# Patient Record
Sex: Male | Born: 1980
Health system: Southern US, Community
[De-identification: ages and names within clinical notes are randomized; demographics above are authoritative.]

## PROBLEM LIST (undated history)

## (undated) DIAGNOSIS — K089 Disorder of teeth and supporting structures, unspecified: Secondary | ICD-10-CM

## (undated) DIAGNOSIS — G8929 Other chronic pain: Secondary | ICD-10-CM

## (undated) HISTORY — PX: APPENDECTOMY: SHX54

---

## 2009-07-16 ENCOUNTER — Emergency Department (HOSPITAL_COMMUNITY): Admission: EM | Admit: 2009-07-16 | Discharge: 2009-07-16 | Payer: Self-pay | Admitting: Emergency Medicine

## 2009-12-20 ENCOUNTER — Emergency Department (HOSPITAL_COMMUNITY): Admission: EM | Admit: 2009-12-20 | Discharge: 2009-12-20 | Payer: Self-pay | Admitting: Emergency Medicine

## 2010-02-27 ENCOUNTER — Emergency Department (HOSPITAL_COMMUNITY): Admission: EM | Admit: 2010-02-27 | Discharge: 2010-02-27 | Payer: Self-pay | Admitting: Emergency Medicine

## 2010-03-17 ENCOUNTER — Emergency Department (HOSPITAL_COMMUNITY): Admission: EM | Admit: 2010-03-17 | Discharge: 2010-03-17 | Payer: Self-pay | Admitting: Emergency Medicine

## 2010-04-05 ENCOUNTER — Emergency Department (HOSPITAL_COMMUNITY): Admission: EM | Admit: 2010-04-05 | Discharge: 2010-04-05 | Payer: Self-pay | Admitting: Emergency Medicine

## 2010-05-05 ENCOUNTER — Emergency Department (HOSPITAL_COMMUNITY): Admission: EM | Admit: 2010-05-05 | Discharge: 2010-05-05 | Payer: Self-pay | Admitting: Emergency Medicine

## 2010-05-18 ENCOUNTER — Emergency Department (HOSPITAL_COMMUNITY): Admission: EM | Admit: 2010-05-18 | Discharge: 2010-05-18 | Payer: Self-pay | Admitting: Emergency Medicine

## 2010-06-02 ENCOUNTER — Emergency Department (HOSPITAL_COMMUNITY): Admission: EM | Admit: 2010-06-02 | Discharge: 2010-06-02 | Payer: Self-pay | Admitting: Emergency Medicine

## 2010-09-05 ENCOUNTER — Emergency Department (HOSPITAL_COMMUNITY): Admission: EM | Admit: 2010-09-05 | Discharge: 2010-09-05 | Payer: Self-pay | Admitting: Emergency Medicine

## 2010-10-29 ENCOUNTER — Emergency Department (HOSPITAL_COMMUNITY)
Admission: EM | Admit: 2010-10-29 | Discharge: 2010-10-29 | Payer: Self-pay | Source: Home / Self Care | Admitting: Emergency Medicine

## 2010-12-29 ENCOUNTER — Emergency Department (HOSPITAL_COMMUNITY)
Admission: EM | Admit: 2010-12-29 | Discharge: 2010-12-29 | Disposition: A | Discharge status: Home / Self Care | Payer: Self-pay | Attending: Emergency Medicine | Admitting: Emergency Medicine

## 2010-12-29 DIAGNOSIS — K029 Dental caries, unspecified: Secondary | ICD-10-CM | POA: Insufficient documentation

## 2010-12-29 DIAGNOSIS — K089 Disorder of teeth and supporting structures, unspecified: Secondary | ICD-10-CM | POA: Insufficient documentation

## 2011-02-04 ENCOUNTER — Emergency Department (HOSPITAL_COMMUNITY)
Admission: EM | Admit: 2011-02-04 | Discharge: 2011-02-04 | Disposition: A | Discharge status: Home / Self Care | Payer: Self-pay | Attending: Emergency Medicine | Admitting: Emergency Medicine

## 2011-02-04 DIAGNOSIS — K089 Disorder of teeth and supporting structures, unspecified: Secondary | ICD-10-CM | POA: Insufficient documentation

## 2011-02-04 DIAGNOSIS — K029 Dental caries, unspecified: Secondary | ICD-10-CM | POA: Insufficient documentation

## 2011-03-25 ENCOUNTER — Emergency Department (HOSPITAL_COMMUNITY)
Admission: EM | Admit: 2011-03-25 | Discharge: 2011-03-25 | Disposition: A | Discharge status: Home / Self Care | Payer: Self-pay | Attending: Emergency Medicine | Admitting: Emergency Medicine

## 2011-03-25 DIAGNOSIS — K089 Disorder of teeth and supporting structures, unspecified: Secondary | ICD-10-CM | POA: Insufficient documentation

## 2011-03-25 DIAGNOSIS — K029 Dental caries, unspecified: Secondary | ICD-10-CM | POA: Insufficient documentation

## 2011-06-25 ENCOUNTER — Emergency Department (HOSPITAL_COMMUNITY)
Admission: EM | Admit: 2011-06-25 | Discharge: 2011-06-25 | Disposition: A | Discharge status: Home / Self Care | Payer: Self-pay | Attending: Emergency Medicine | Admitting: Emergency Medicine

## 2011-06-25 ENCOUNTER — Encounter: Payer: Self-pay | Admitting: Emergency Medicine

## 2011-06-25 DIAGNOSIS — Z87891 Personal history of nicotine dependence: Secondary | ICD-10-CM | POA: Insufficient documentation

## 2011-06-25 DIAGNOSIS — K029 Dental caries, unspecified: Secondary | ICD-10-CM

## 2011-06-25 DIAGNOSIS — K089 Disorder of teeth and supporting structures, unspecified: Secondary | ICD-10-CM | POA: Insufficient documentation

## 2011-06-25 DIAGNOSIS — K0500 Acute gingivitis, plaque induced: Secondary | ICD-10-CM

## 2011-06-25 MED ORDER — IBUPROFEN 800 MG PO TABS: 800.0000 mg | ORAL_TABLET | Freq: Three times a day (TID) | ORAL | Status: AC

## 2011-06-25 MED ORDER — IBUPROFEN 800 MG PO TABS
800.0000 mg | ORAL_TABLET | Freq: Once | ORAL | Status: AC
  Administered 2011-06-25: 800 mg via ORAL
  Filled 2011-06-25: qty 1

## 2011-06-25 MED ORDER — AMOXICILLIN 500 MG PO CAPS: 500.0000 mg | ORAL_CAPSULE | Freq: Three times a day (TID) | ORAL | Status: AC

## 2011-06-25 NOTE — ED Notes (Signed)
C/o front lower teeth pain x 3 days; pt has multiple caries and broken teeth in this area; states has a dentist to f/u with, but is trying to save the money to get in for appt.

## 2011-06-25 NOTE — ED Notes (Signed)
Pt c/o dental pain x 3 days.

## 2011-06-25 NOTE — ED Provider Notes (Signed)
History     Chief Complaint  Patient presents with  . Dental Pain   Patient is a 30 y.o. male presenting with tooth pain. The history is provided by the patient.  Dental PainThe primary symptoms include mouth pain. Primary symptoms do not include headaches, fever, shortness of breath or sore throat. The symptoms began 3 to 5 days ago. The symptoms are worsening. The symptoms are chronic. The symptoms occur constantly (Reports chronic dental pain worse the past 3 days.  Is scheduled to see a dentist the end of August for extractions.).  Additional symptoms include: dental sensitivity to temperature, gum swelling and gum tenderness. Additional symptoms do not include: purulent gums, facial swelling, trouble swallowing and ear pain.    History reviewed. No pertinent past medical history.  History reviewed. No pertinent past surgical history.  History reviewed. No pertinent family history.  History  Substance Use Topics  . Smoking status: Former Games developer  . Smokeless tobacco: Not on file  . Alcohol Use: No      Review of Systems  Constitutional: Negative for fever.  HENT: Negative for ear pain, congestion, sore throat, facial swelling, trouble swallowing, neck pain and neck stiffness.   Eyes: Negative.   Respiratory: Negative for chest tightness and shortness of breath.   Cardiovascular: Negative for chest pain.  Gastrointestinal: Negative for nausea and abdominal pain.  Genitourinary: Negative.   Musculoskeletal: Negative for joint swelling and arthralgias.  Skin: Negative.  Negative for rash and wound.  Neurological: Negative for dizziness, weakness, light-headedness, numbness and headaches.  Hematological: Negative.   Psychiatric/Behavioral: Negative.     Physical Exam  BP 155/97  Pulse 73  Temp(Src) 97.4 F (36.3 C) (Oral)  Resp 17  Ht 5\' 6"  (1.676 m)  Wt 160 lb (72.576 kg)  BMI 25.82 kg/m2  SpO2 100%  Physical Exam  Vitals reviewed. Constitutional: He is oriented  to person, place, and time. He appears well-developed and well-nourished.  HENT:  Head: Normocephalic and atraumatic.  Right Ear: External ear normal.  Left Ear: External ear normal.       Entire dentition is present,  But blackened and mostly broken off to gingival line,  No obvious abscess identified,  But gingivitis present.  Eyes: Conjunctivae are normal.  Neck: Normal range of motion. No thyromegaly present.  Cardiovascular: Normal rate, regular rhythm, normal heart sounds and intact distal pulses.   Pulmonary/Chest: Effort normal and breath sounds normal. He has no wheezes.  Abdominal: Soft. Bowel sounds are normal. There is no tenderness.  Musculoskeletal: Normal range of motion.  Lymphadenopathy:    He has no cervical adenopathy.    He has no axillary adenopathy.  Neurological: He is alert and oriented to person, place, and time.  Skin: Skin is warm and dry.  Psychiatric: He has a normal mood and affect.    ED Course  Procedures   Offered dental blocks,  Patient refused. MDM No abscess identified today. Poor dentition throughout with multiple previous visits for same.  Patient has tried no home meds for relief.      Candis Musa, PA 06/25/11 1130  Medical screening examination/treatment/procedure(s) were performed by non-physician practitioner and as supervising physician I was immediately available for consultation/collaboration.   Sunnie Nielsen, MD 06/29/11 [phone]

## 2011-06-25 NOTE — ED Notes (Signed)
A&ox4; in no distress; instructions reviewed and written copy given; verbalizes understanding

## 2011-10-09 ENCOUNTER — Emergency Department (HOSPITAL_COMMUNITY)
Admission: EM | Admit: 2011-10-09 | Discharge: 2011-10-09 | Disposition: A | Discharge status: Home / Self Care | Payer: Self-pay | Attending: Emergency Medicine | Admitting: Emergency Medicine

## 2011-10-09 ENCOUNTER — Encounter (HOSPITAL_COMMUNITY): Payer: Self-pay | Admitting: *Deleted

## 2011-10-09 DIAGNOSIS — K047 Periapical abscess without sinus: Secondary | ICD-10-CM | POA: Insufficient documentation

## 2011-10-09 DIAGNOSIS — R220 Localized swelling, mass and lump, head: Secondary | ICD-10-CM | POA: Insufficient documentation

## 2011-10-09 DIAGNOSIS — R110 Nausea: Secondary | ICD-10-CM | POA: Insufficient documentation

## 2011-10-09 DIAGNOSIS — R221 Localized swelling, mass and lump, neck: Secondary | ICD-10-CM | POA: Insufficient documentation

## 2011-10-09 DIAGNOSIS — K0889 Other specified disorders of teeth and supporting structures: Secondary | ICD-10-CM

## 2011-10-09 DIAGNOSIS — K029 Dental caries, unspecified: Secondary | ICD-10-CM | POA: Insufficient documentation

## 2011-10-09 DIAGNOSIS — K089 Disorder of teeth and supporting structures, unspecified: Secondary | ICD-10-CM | POA: Insufficient documentation

## 2011-10-09 MED ORDER — AMOXICILLIN 500 MG PO CAPS: 500.0000 mg | ORAL_CAPSULE | Freq: Three times a day (TID) | ORAL | Status: AC

## 2011-10-09 MED ORDER — HYDROCODONE-ACETAMINOPHEN 5-325 MG PO TABS: ORAL_TABLET | ORAL | Status: AC

## 2011-10-09 NOTE — ED Notes (Signed)
Pt c/o abscess teeth; one on each top and one on right lower bottom; pt c/o pain x 3 days

## 2011-10-09 NOTE — ED Provider Notes (Signed)
Medical screening examination/treatment/procedure(s) were performed by non-physician practitioner and as supervising physician I was immediately available for consultation/collaboration.  Geoffery Lyons, MD 10/09/11 [phone]

## 2011-10-09 NOTE — ED Provider Notes (Signed)
History     CSN: [id number] Arrival date & time: 10/09/2011  8:20 AM   First MD Initiated Contact with Patient 10/09/11 0801      Chief Complaint  Patient presents with  . abscess tooth     (Consider location/radiation/quality/duration/timing/severity/associated sxs/prior treatment) Patient is a 30 y.o. male presenting with tooth pain. The history is provided by the patient.  Dental PainThe primary symptoms include mouth pain. Primary symptoms do not include oral bleeding, oral lesions, headaches, fever, sore throat or angioedema. The symptoms began 3 to 5 days ago. The symptoms are unchanged. The symptoms are recurrent. The symptoms occur constantly.  Mouth pain began 3 - 5 days ago. Mouth pain occurs constantly. Mouth pain is unchanged. Affected locations include: teeth and gum(s). The mouth pain is currently at 10/10.  Additional symptoms include: gum swelling and gum tenderness. Additional symptoms do not include: purulent gums, trismus, jaw pain, facial swelling, trouble swallowing, pain with swallowing, swollen glands and fatigue. Medical issues include: smoking and periodontal disease.    History reviewed. No pertinent past medical history.  History reviewed. No pertinent past surgical history.  History reviewed. No pertinent family history.  History  Substance Use Topics  . Smoking status: Former Games developer  . Smokeless tobacco: Not on file  . Alcohol Use: No      Review of Systems  Constitutional: Negative for fever, chills and fatigue.  HENT: Positive for dental problem. Negative for sore throat, facial swelling, mouth sores, trouble swallowing, neck pain and neck stiffness.   Eyes: Negative for visual disturbance.  Gastrointestinal: Positive for nausea.  Musculoskeletal: Negative for myalgias.  Skin: Negative.  Negative for rash.  Neurological: Negative for dizziness, facial asymmetry, weakness, numbness and headaches.  Hematological: Negative for adenopathy. Does  not bruise/bleed easily.  All other systems reviewed and are negative.    Allergies  Review of patient's allergies indicates no known allergies.  Home Medications  No current outpatient prescriptions on file.  BP 137/90  Pulse 81  Temp(Src) 97.7 F (36.5 C) (Oral)  Resp 18  Ht 5\' 6"  (1.676 m)  Wt 150 lb (68.04 kg)  BMI 24.21 kg/m2  SpO2 100%  Physical Exam  Nursing note and vitals reviewed. Constitutional: He is oriented to person, place, and time. He appears well-developed and well-nourished. No distress.  HENT:  Head: Normocephalic and atraumatic. No trismus in the jaw.  Right Ear: Tympanic membrane normal. No mastoid tenderness. No hemotympanum.  Left Ear: Tympanic membrane normal. No mastoid tenderness. No hemotympanum.  Mouth/Throat: Uvula is midline, oropharynx is clear and moist and mucous membranes are normal. He does not have dentures. No oral lesions. Abnormal dentition. Dental caries present. No dental abscesses, uvula swelling or lacerations.       patient has widespread dental decay.  All teeth are black in color and decayed to the gum line.  Swelling and erythema of the gums of the right lower first molar and left upper first molar and right upper second premolar.  Neck: Normal range of motion. Neck supple.  Cardiovascular: Normal rate, regular rhythm and normal heart sounds.   Pulmonary/Chest: Effort normal and breath sounds normal. No respiratory distress. He exhibits no tenderness.  Lymphadenopathy:    He has no cervical adenopathy.  Neurological: He is alert and oriented to person, place, and time. No cranial nerve deficit. He exhibits normal muscle tone. Coordination normal.  Skin: Skin is warm and dry.    ED Course  Procedures (including critical care time)  MDM     8:48 AM patient has widespread dental decay.  All teeth are black in color and decayed to the gum line.  Swelling and erythema of the gums of the right lower first molar and left  upper first molar and right upper second premolar.  No obvious dental abscess at this time.  Patient has been here multiple times for dental issues.  No fever, facial swelling or trismus  Patient / Family / Caregiver understand and agree with initial ED impression and plan with expectations set for ED visit.      Ventura Leggitt L. [place], [place] 10/09/11 [phone]

## 2011-11-18 ENCOUNTER — Emergency Department (HOSPITAL_COMMUNITY)
Admission: EM | Admit: 2011-11-18 | Discharge: 2011-11-18 | Disposition: A | Discharge status: Home / Self Care | Payer: Self-pay | Attending: Emergency Medicine | Admitting: Emergency Medicine

## 2011-11-18 ENCOUNTER — Encounter (HOSPITAL_COMMUNITY): Payer: Self-pay

## 2011-11-18 DIAGNOSIS — K029 Dental caries, unspecified: Secondary | ICD-10-CM | POA: Insufficient documentation

## 2011-11-18 DIAGNOSIS — K0889 Other specified disorders of teeth and supporting structures: Secondary | ICD-10-CM

## 2011-11-18 DIAGNOSIS — K089 Disorder of teeth and supporting structures, unspecified: Secondary | ICD-10-CM | POA: Insufficient documentation

## 2011-11-18 MED ORDER — AMOXICILLIN 500 MG PO CAPS: 500.0000 mg | ORAL_CAPSULE | Freq: Three times a day (TID) | ORAL | Status: AC

## 2011-11-18 MED ORDER — HYDROCODONE-ACETAMINOPHEN 5-325 MG PO TABS: ORAL_TABLET | ORAL | Status: AC

## 2011-11-18 MED ORDER — HYDROCODONE-ACETAMINOPHEN 5-325 MG PO TABS
1.0000 | ORAL_TABLET | Freq: Once | ORAL | Status: AC
  Administered 2011-11-18: 1 via ORAL
  Filled 2011-11-18: qty 1

## 2011-11-18 MED ORDER — PENICILLIN V POTASSIUM 250 MG PO TABS
500.0000 mg | ORAL_TABLET | Freq: Once | ORAL | Status: AC
  Administered 2011-11-18: 500 mg via ORAL
  Filled 2011-11-18: qty 2

## 2011-11-18 NOTE — ED Notes (Signed)
Pt presents with upper and lower left dental pain x 2 days.

## 2011-11-18 NOTE — ED Provider Notes (Signed)
History     CSN: [id number]  Arrival date & time 11/18/11  1739   First MD Initiated Contact with Patient 11/18/11 1747      Chief Complaint  Patient presents with  . Dental Pain    (Consider location/radiation/quality/duration/timing/severity/associated sxs/prior treatment) Patient is a 30 y.o. male presenting with tooth pain. The history is provided by the patient.  Dental PainThe primary symptoms include mouth pain. Primary symptoms do not include dental injury, oral bleeding, headaches, fever, shortness of breath, sore throat, angioedema or cough. The symptoms began 2 days ago. The symptoms are worsening. The symptoms are recurrent. The symptoms occur constantly.  Mouth pain began 24 -48 hours ago. Mouth pain occurs constantly. Affected locations include: teeth and gum(s). The mouth pain is currently at 10/10.  Additional symptoms include: dental sensitivity to temperature, gum swelling and gum tenderness. Additional symptoms do not include: purulent gums, trismus, jaw pain, facial swelling, trouble swallowing, drooling, ear pain and swollen glands. Medical issues include: periodontal disease.    History reviewed. No pertinent past medical history.  History reviewed. No pertinent past surgical history.  History reviewed. No pertinent family history.  History  Substance Use Topics  . Smoking status: Former Games developer  . Smokeless tobacco: Not on file  . Alcohol Use: No      Review of Systems  Constitutional: Negative for fever and chills.  HENT: Positive for dental problem. Negative for ear pain, sore throat, facial swelling, drooling, trouble swallowing, neck pain and neck stiffness.   Respiratory: Negative for cough and shortness of breath.   Gastrointestinal: Negative for nausea and vomiting.  Skin: Negative for rash.  Neurological: Negative for dizziness, weakness, numbness and headaches.  Hematological: Does not bruise/bleed easily.  All other systems reviewed and are  negative.    Allergies  Review of patient's allergies indicates no known allergies.  Home Medications  No current outpatient prescriptions on file.  BP 151/84  Pulse 84  Temp(Src) 98 F (36.7 C) (Oral)  Resp 18  Ht 5\' 6"  (1.676 m)  Wt 140 lb (63.504 kg)  BMI 22.60 kg/m2  SpO2 100%  Physical Exam  Nursing note and vitals reviewed. Constitutional: He is oriented to person, place, and time. He appears well-developed and well-nourished. No distress.  HENT:  Head: Normocephalic and atraumatic. No trismus in the jaw.  Right Ear: Tympanic membrane and ear canal normal. No mastoid tenderness. No hemotympanum.  Left Ear: Tympanic membrane and ear canal normal. No mastoid tenderness. No hemotympanum.  Mouth/Throat: Uvula is midline, oropharynx is clear and moist and mucous membranes are normal. Dental caries present. No dental abscesses or uvula swelling.       Multiple dental caries.  Erythema and edema of the left upper gums  Neck: Normal range of motion. Neck supple.  Cardiovascular: Normal rate, regular rhythm and normal heart sounds.   Pulmonary/Chest: Effort normal and breath sounds normal. No respiratory distress. He exhibits no tenderness.  Abdominal: Soft.  Musculoskeletal: Normal range of motion. He exhibits no tenderness.  Lymphadenopathy:    He has no cervical adenopathy.  Neurological: He is alert and oriented to person, place, and time. No cranial nerve deficit. He exhibits normal muscle tone. Coordination normal.  Skin: Skin is warm.    ED Course  Procedures (including critical care time)          MDM    Pt has multiple dental caries with tooth decay extending to the gums.  Remaining teeth are black in color.  Erythema  and mild swelling of the gums surrounding the left upper first molar.  Non-toxic appearing.  No trismus.          Avonell Lenig L. [place], [place] 11/18/11 1818

## 2011-11-18 NOTE — ED Provider Notes (Signed)
Medical screening examination/treatment/procedure(s) were performed by non-physician practitioner and as supervising physician I was immediately available for consultation/collaboration. Devoria Albe, MD, [facility]   Ward Givens, MD 11/18/11 [phone]

## 2011-12-07 ENCOUNTER — Emergency Department (HOSPITAL_COMMUNITY)
Admission: EM | Admit: 2011-12-07 | Discharge: 2011-12-07 | Disposition: A | Discharge status: Home / Self Care | Payer: Self-pay | Attending: Emergency Medicine | Admitting: Emergency Medicine

## 2011-12-07 ENCOUNTER — Encounter (HOSPITAL_COMMUNITY): Payer: Self-pay | Admitting: Emergency Medicine

## 2011-12-07 DIAGNOSIS — K029 Dental caries, unspecified: Secondary | ICD-10-CM | POA: Insufficient documentation

## 2011-12-07 DIAGNOSIS — K089 Disorder of teeth and supporting structures, unspecified: Secondary | ICD-10-CM | POA: Insufficient documentation

## 2011-12-07 DIAGNOSIS — K0381 Cracked tooth: Secondary | ICD-10-CM | POA: Insufficient documentation

## 2011-12-07 DIAGNOSIS — K0889 Other specified disorders of teeth and supporting structures: Secondary | ICD-10-CM

## 2011-12-07 DIAGNOSIS — K1370 Unspecified lesions of oral mucosa: Secondary | ICD-10-CM | POA: Insufficient documentation

## 2011-12-07 MED ORDER — HYDROCODONE-ACETAMINOPHEN 5-325 MG PO TABS: ORAL_TABLET | ORAL | Status: DC

## 2011-12-07 MED ORDER — PENICILLIN V POTASSIUM 500 MG PO TABS: 500.0000 mg | ORAL_TABLET | Freq: Four times a day (QID) | ORAL | Status: AC

## 2011-12-07 MED ORDER — PENICILLIN V POTASSIUM 250 MG PO TABS
500.0000 mg | ORAL_TABLET | Freq: Once | ORAL | Status: AC
  Administered 2011-12-07: 500 mg via ORAL
  Filled 2011-12-07: qty 2

## 2011-12-07 MED ORDER — IBUPROFEN 800 MG PO TABS
800.0000 mg | ORAL_TABLET | Freq: Once | ORAL | Status: AC
  Administered 2011-12-07: 800 mg via ORAL
  Filled 2011-12-07: qty 1

## 2011-12-07 MED ORDER — HYDROCODONE-ACETAMINOPHEN 5-325 MG PO TABS
1.0000 | ORAL_TABLET | Freq: Once | ORAL | Status: AC
  Administered 2011-12-07: 1 via ORAL
  Filled 2011-12-07: qty 1

## 2011-12-07 MED ORDER — PENICILLIN V POTASSIUM 250 MG PO TABS: 500.0000 mg | ORAL_TABLET | Freq: Once | ORAL | Status: DC

## 2011-12-07 MED ORDER — IBUPROFEN 800 MG PO TABS: 800.0000 mg | ORAL_TABLET | Freq: Once | ORAL | Status: DC

## 2011-12-07 MED ORDER — HYDROCODONE-ACETAMINOPHEN 5-325 MG PO TABS: 1.0000 | ORAL_TABLET | Freq: Once | ORAL | Status: DC

## 2011-12-07 NOTE — ED Provider Notes (Signed)
Medical screening examination/treatment/procedure(s) were performed by non-physician practitioner and as supervising physician I was immediately available for consultation/collaboration.  Donnetta Hutching, MD 12/07/11 [phone]

## 2011-12-07 NOTE — ED Provider Notes (Signed)
History     CSN: [id number]  Arrival date & time 12/07/11  4098   First MD Initiated Contact with Patient 12/07/11 [phone]      Chief Complaint  Patient presents with  . Dental Pain    (Consider location/radiation/quality/duration/timing/severity/associated sxs/prior treatment) Patient is a 31 y.o. male presenting with tooth pain. The history is provided by the patient. No language interpreter was used.  Dental PainThe primary symptoms include mouth pain and dental injury. Primary symptoms do not include fever. The symptoms began 2 days ago. The symptoms are unchanged. The symptoms occur constantly.  Medical issues include: periodontal disease.    History reviewed. No pertinent past medical history.  History reviewed. No pertinent past surgical history.  History reviewed. No pertinent family history.  History  Substance Use Topics  . Smoking status: Former Smoker -- 1.0 packs/day for 1.5 years    Types: Cigarettes    Quit date: 11/25/2010  . Smokeless tobacco: Never Used  . Alcohol Use: No      Review of Systems  Constitutional: Negative for fever.  HENT: Positive for mouth sores and dental problem.   All other systems reviewed and are negative.    Allergies  Review of patient's allergies indicates no known allergies.  Home Medications  No current outpatient prescriptions on file.  BP 134/66  Pulse 64  Temp(Src) 97.9 F (36.6 C) (Oral)  Resp 18  Ht 5\' 6"  (1.676 m)  Wt 140 lb (63.504 kg)  BMI 22.60 kg/m2  SpO2 100%  Physical Exam  Nursing note and vitals reviewed. Constitutional: He is oriented to person, place, and time. He appears well-developed and well-nourished.  HENT:  Head: Normocephalic and atraumatic.  Right Ear: External ear normal.  Left Ear: External ear normal.  Nose: Nose normal.  Mouth/Throat: Oropharynx is clear and moist. No oropharyngeal exudate.       Widespread dental decay evident.  Pt localizes pain to a L lower pre-molar which he  says broke a couple days ago while eating.   Eyes: EOM are normal.  Neck: Normal range of motion.  Cardiovascular: Normal rate, regular rhythm, normal heart sounds and intact distal pulses.   Pulmonary/Chest: Effort normal and breath sounds normal. No respiratory distress.  Abdominal: Soft. He exhibits no distension. There is no tenderness.  Musculoskeletal: Normal range of motion.  Neurological: He is alert and oriented to person, place, and time.  Skin: Skin is warm and dry.  Psychiatric: He has a normal mood and affect. Judgment normal.    ED Course  Procedures (including critical care time)  Labs Reviewed - No data to display No results found.   No diagnosis found.    MDM          Worthy Rancher, PA 12/07/11 [phone]

## 2011-12-07 NOTE — ED Notes (Signed)
Patient c/o right upper and lower dental pain. Broken teeth and carries noted.

## 2011-12-23 ENCOUNTER — Encounter (HOSPITAL_COMMUNITY): Payer: Self-pay | Admitting: *Deleted

## 2011-12-23 ENCOUNTER — Emergency Department (HOSPITAL_COMMUNITY)
Admission: EM | Admit: 2011-12-23 | Discharge: 2011-12-23 | Disposition: A | Discharge status: Home / Self Care | Payer: Self-pay | Attending: Emergency Medicine | Admitting: Emergency Medicine

## 2011-12-23 DIAGNOSIS — K089 Disorder of teeth and supporting structures, unspecified: Secondary | ICD-10-CM | POA: Insufficient documentation

## 2011-12-23 DIAGNOSIS — K029 Dental caries, unspecified: Secondary | ICD-10-CM | POA: Insufficient documentation

## 2011-12-23 MED ORDER — IBUPROFEN 600 MG PO TABS: 600.0000 mg | ORAL_TABLET | Freq: Four times a day (QID) | ORAL | Status: AC | PRN

## 2011-12-23 MED ORDER — AMOXICILLIN 500 MG PO CAPS: 500.0000 mg | ORAL_CAPSULE | Freq: Three times a day (TID) | ORAL | Status: AC

## 2011-12-23 NOTE — ED Provider Notes (Signed)
History     CSN: [id number]  Arrival date & time 12/23/11  4098   First MD Initiated Contact with Patient 12/23/11 0913      Chief Complaint  Patient presents with  . Dental Pain    (Consider location/radiation/quality/duration/timing/severity/associated sxs/prior treatment) Patient is a 31 y.o. male presenting with tooth pain. The history is provided by the patient.  Dental PainThe primary symptoms include mouth pain. Primary symptoms do not include dental injury, fever or sore throat. The symptoms began 2 days ago. The symptoms are worsening. The symptoms occur constantly.  Additional symptoms include: dental sensitivity to temperature, gum swelling, gum tenderness and jaw pain. Additional symptoms do not include: trismus, trouble swallowing and pain with swallowing.  Pt with severe dental decay in all teeth, here with increased pain in left lower jaw and left upper jaw. States has appointment with a dentist in one month. Unable to see them sooner due to financial strain. PT denies any fever, chills, facial swelling, malaise. Denies taking any medications. Hx of previous multiple visits here for the same.   History reviewed. No pertinent past medical history.  History reviewed. No pertinent past surgical history.  No family history on file.  History  Substance Use Topics  . Smoking status: Former Smoker -- 1.0 packs/day for 1.5 years    Types: Cigarettes    Quit date: 11/25/2010  . Smokeless tobacco: Never Used  . Alcohol Use: No      Review of Systems  Constitutional: Negative for fever and chills.  HENT: Positive for dental problem. Negative for sore throat and trouble swallowing.   Eyes: Negative.   Respiratory: Negative.   Cardiovascular: Negative.   Gastrointestinal: Negative.   Genitourinary: Negative.   Musculoskeletal: Negative.   Neurological: Negative.   Psychiatric/Behavioral: Negative.     Allergies  Review of patient's allergies indicates no known  allergies.  Home Medications   Current Outpatient Rx  Name Route Sig Dispense Refill  . HYDROCODONE-ACETAMINOPHEN 5-325 MG PO TABS  One po q 4-6 hrs prn pain 20 tablet 0  . AMOXICILLIN 500 MG PO CAPS Oral Take 1 capsule (500 mg total) by mouth 3 (three) times daily. 21 capsule 0  . IBUPROFEN 600 MG PO TABS Oral Take 1 tablet (600 mg total) by mouth every 6 (six) hours as needed for pain. 30 tablet 0    BP 134/85  Pulse 80  Temp(Src) 98.1 F (36.7 C) (Oral)  Resp 18  Wt 160 lb (72.576 kg)  SpO2 100%  Physical Exam  Nursing note and vitals reviewed. Constitutional: He appears well-developed and well-nourished. No distress.  HENT:  Head: Normocephalic.  Right Ear: External ear normal.  Left Ear: External ear normal.  Nose: Nose normal.  Mouth/Throat: Oropharynx is clear and moist.       Dental decay of all teeth, most eroded and decayed down to the gumline. No obvious gum swelling, or an abscess. Tender to palpation over left lower 1st and 2nd molar and over left upper 1st molar.   Eyes: Conjunctivae are normal.  Neck: Neck supple.  Cardiovascular: Normal rate and regular rhythm.   Pulmonary/Chest: Effort normal and breath sounds normal. No respiratory distress.  Musculoskeletal: Normal range of motion.  Neurological: He is alert.  Skin: Skin is warm and dry.  Psychiatric: He has a normal mood and affect.    ED Course  Procedures (including critical care time)  Widespread dental decay. Pt needs to see an oral surgeon to get teeth pulled  out. Here afebrile, no facial swelling, no signs of an abscess on exam. Possble gingivitis. Will treat with amoxicillin and follow up.  1. Dental decay       MDM          Lottie Mussel, PA 12/23/11 [phone]

## 2011-12-23 NOTE — ED Notes (Signed)
Pt states "Have an appt with Dr. Hewitt Blade the end of February, I haven't had the money to go but I'll have my taxes back then"; pt presents with all teeth decayed & to the gumline.

## 2011-12-23 NOTE — ED Provider Notes (Signed)
Medical screening examination/treatment/procedure(s) were performed by non-physician practitioner and as supervising physician I was immediately available for consultation/collaboration.  Nicholes Stairs, MD 12/23/11 1016

## 2012-03-05 ENCOUNTER — Encounter (HOSPITAL_COMMUNITY): Payer: Self-pay | Admitting: *Deleted

## 2012-03-05 ENCOUNTER — Emergency Department (HOSPITAL_COMMUNITY)
Admission: EM | Admit: 2012-03-05 | Discharge: 2012-03-05 | Disposition: A | Discharge status: Home / Self Care | Payer: Self-pay | Attending: Emergency Medicine | Admitting: Emergency Medicine

## 2012-03-05 DIAGNOSIS — K029 Dental caries, unspecified: Secondary | ICD-10-CM | POA: Insufficient documentation

## 2012-03-05 DIAGNOSIS — Z87891 Personal history of nicotine dependence: Secondary | ICD-10-CM | POA: Insufficient documentation

## 2012-03-05 DIAGNOSIS — K0889 Other specified disorders of teeth and supporting structures: Secondary | ICD-10-CM

## 2012-03-05 MED ORDER — OXYCODONE-ACETAMINOPHEN 5-325 MG PO TABS: ORAL_TABLET | ORAL | Status: DC

## 2012-03-05 MED ORDER — AMOXICILLIN 500 MG PO CAPS: ORAL_CAPSULE | ORAL | Status: DC

## 2012-03-05 NOTE — ED Notes (Signed)
Dental pain , left jaw

## 2012-03-05 NOTE — ED Provider Notes (Signed)
History     CSN: [id number]  Arrival date & time 03/05/12  1041   First MD Initiated Contact with Patient 03/05/12 1228      Chief Complaint  Patient presents with  . Dental Pain    (Consider location/radiation/quality/duration/timing/severity/associated sxs/prior treatment) Patient is a 31 y.o. male presenting with tooth pain. The history is provided by the patient.  Dental PainThe primary symptoms include mouth pain and headaches. Primary symptoms do not include shortness of breath or cough. The symptoms began 3 to 5 days ago. The symptoms are worsening. The symptoms occur constantly.  The headache is not associated with photophobia.  Additional symptoms include: gum tenderness and jaw pain. Additional symptoms do not include: nosebleeds. Medical issues do not include: alcohol problem, smoking and immunosuppression.    History reviewed. No pertinent past medical history.  History reviewed. No pertinent past surgical history.  No family history on file.  History  Substance Use Topics  . Smoking status: Former Smoker -- 1.0 packs/day for 1.5 years    Types: Cigarettes    Quit date: 11/25/2010  . Smokeless tobacco: Never Used  . Alcohol Use: No      Review of Systems  Constitutional: Negative for activity change.       All ROS Neg except as noted in HPI  HENT: Positive for dental problem. Negative for nosebleeds and neck pain.   Eyes: Negative for photophobia and discharge.  Respiratory: Negative for cough, shortness of breath and wheezing.   Cardiovascular: Negative for chest pain and palpitations.  Gastrointestinal: Negative for abdominal pain and blood in stool.  Genitourinary: Negative for dysuria, frequency and hematuria.  Musculoskeletal: Negative for back pain and arthralgias.  Skin: Negative.   Neurological: Positive for headaches. Negative for dizziness, seizures and speech difficulty.  Psychiatric/Behavioral: Negative for hallucinations and confusion.     Allergies  Review of patient's allergies indicates no known allergies.  Home Medications   Current Outpatient Rx  Name Route Sig Dispense Refill  . HYDROCODONE-ACETAMINOPHEN 5-325 MG PO TABS  One po q 4-6 hrs prn pain 20 tablet 0    BP 128/74  Pulse 81  Temp(Src) 97.5 F (36.4 C) (Oral)  Resp 20  Ht 5\' 6"  (1.676 m)  Wt 150 lb (68.04 kg)  BMI 24.21 kg/m2  SpO2 100%  Physical Exam  Nursing note and vitals reviewed. Constitutional: He is oriented to person, place, and time. He appears well-developed and well-nourished.  Non-toxic appearance.  HENT:  Head: Normocephalic.  Right Ear: Tympanic membrane and external ear normal.  Left Ear: Tympanic membrane and external ear normal.       There is extensive dental caries of the upper and lower jaw areas. There are multiple teeth that are decayed to and below the gumline. There is some swelling of the common on the right lower area. No visible abscess appreciated. The airway is patent. The speech is understandable.  Eyes: EOM and lids are normal. Pupils are equal, round, and reactive to light.  Neck: Normal range of motion. Neck supple. Carotid bruit is not present.  Cardiovascular: Normal rate, regular rhythm, normal heart sounds, intact distal pulses and normal pulses.   Pulmonary/Chest: Breath sounds normal. No respiratory distress.  Abdominal: Soft. Bowel sounds are normal. There is no tenderness. There is no guarding.  Musculoskeletal: Normal range of motion.  Lymphadenopathy:       Head (right side): No submandibular adenopathy present.       Head (left side): No submandibular adenopathy present.  He has no cervical adenopathy.  Neurological: He is alert and oriented to person, place, and time. He has normal strength. No cranial nerve deficit or sensory deficit.  Skin: Skin is warm and dry.  Psychiatric: He has a normal mood and affect. His speech is normal.    ED Course  Procedures (including critical care  time)  Labs Reviewed - No data to display No results found.   No diagnosis found.    MDM  I have reviewed nursing notes, vital signs, and all appropriate lab and imaging results for this patient. Patient has multiple dental caries of the upper and lower jaw area. There is no visible abscess appreciated. Patient treated with amoxicillin, and narcotic pain medication. Patient strongly advised to see a dentist as soon as possible. Inflammation along the dental [place] the [place] has been given.       Kathie Dike, [place] 03/05/12 1243

## 2012-03-05 NOTE — ED Notes (Signed)
Pt DC to home with steady gait 

## 2012-03-05 NOTE — Discharge Instructions (Signed)
Dental Pain  A tooth ache may be caused by cavities (tooth decay). Cavities expose the nerve of the tooth to air and hot or cold temperatures. It may come from an infection or abscess (also called a boil or furuncle) around your tooth. It is also often caused by dental caries (tooth decay). This causes the pain you are having.  DIAGNOSIS   Your caregiver can diagnose this problem by exam.  TREATMENT   · If caused by an infection, it may be treated with medications which kill germs (antibiotics) and pain medications as prescribed by your caregiver. Take medications as directed.  · Only take over-the-counter or prescription medicines for pain, discomfort, or fever as directed by your caregiver.  · Whether the tooth ache today is caused by infection or dental disease, you should see your dentist as soon as possible for further care.  SEEK MEDICAL CARE IF:  The exam and treatment you received today has been provided on an emergency basis only. This is not a substitute for complete medical or dental care. If your problem worsens or new problems (symptoms) appear, and you are unable to meet with your dentist, call or return to this location.  SEEK IMMEDIATE MEDICAL CARE IF:   · You have a fever.  · You develop redness and swelling of your face, jaw, or neck.  · You are unable to open your mouth.  · You have severe pain uncontrolled by pain medicine.  MAKE SURE YOU:   · Understand these instructions.  · Will watch your condition.  · Will get help right away if you are not doing well or get worse.  Document Released: 11/11/2005 Document Revised: 10/31/2011 Document Reviewed: 06/29/2008  [employer]® Patient Information ©2012 [employer].

## 2012-03-21 NOTE — ED Provider Notes (Signed)
Evaluation and management procedures were performed by the PA/NP/resident physician under my supervision/collaboration.   Felisa Bonier, MD 03/21/12 2013

## 2012-04-15 ENCOUNTER — Encounter (HOSPITAL_COMMUNITY): Payer: Self-pay | Admitting: Emergency Medicine

## 2012-04-15 ENCOUNTER — Emergency Department (HOSPITAL_COMMUNITY)
Admission: EM | Admit: 2012-04-15 | Discharge: 2012-04-15 | Disposition: A | Discharge status: Home / Self Care | Payer: Self-pay | Attending: Emergency Medicine | Admitting: Emergency Medicine

## 2012-04-15 DIAGNOSIS — K0889 Other specified disorders of teeth and supporting structures: Secondary | ICD-10-CM

## 2012-04-15 DIAGNOSIS — K089 Disorder of teeth and supporting structures, unspecified: Secondary | ICD-10-CM | POA: Insufficient documentation

## 2012-04-15 DIAGNOSIS — K029 Dental caries, unspecified: Secondary | ICD-10-CM | POA: Insufficient documentation

## 2012-04-15 MED ORDER — OXYCODONE-ACETAMINOPHEN 5-325 MG PO TABS: ORAL_TABLET | ORAL | Status: DC

## 2012-04-15 MED ORDER — OXYCODONE-ACETAMINOPHEN 5-325 MG PO TABS
1.0000 | ORAL_TABLET | Freq: Once | ORAL | Status: AC
  Administered 2012-04-15: 1 via ORAL
  Filled 2012-04-15: qty 1

## 2012-04-15 MED ORDER — AMOXICILLIN 250 MG PO CAPS
500.0000 mg | ORAL_CAPSULE | Freq: Once | ORAL | Status: AC
  Administered 2012-04-15: 500 mg via ORAL
  Filled 2012-04-15: qty 2

## 2012-04-15 MED ORDER — AMOXICILLIN 500 MG PO CAPS: 500.0000 mg | ORAL_CAPSULE | Freq: Three times a day (TID) | ORAL | Status: AC

## 2012-04-15 MED ORDER — IBUPROFEN 800 MG PO TABS
800.0000 mg | ORAL_TABLET | Freq: Once | ORAL | Status: AC
  Administered 2012-04-15: 800 mg via ORAL
  Filled 2012-04-15: qty 1

## 2012-04-15 NOTE — ED Provider Notes (Signed)
History     CSN: [id number]  Arrival date & time 04/15/12  0909   First MD Initiated Contact with Patient 04/15/12 [phone]      Chief Complaint  Patient presents with  . Dental Pain    (Consider location/radiation/quality/duration/timing/severity/associated sxs/prior treatment) HPI Comments: Chronic dental pain.  States he has appt in 2 weeks with dr. Warren Danes to have teeth extracted.    Patient is a 31 y.o. male presenting with tooth pain. The history is provided by the patient. No language interpreter was used.  Dental PainThe primary symptoms include mouth pain. Primary symptoms do not include dental injury or fever. Episode onset: chronic. The symptoms are unchanged. The symptoms occur constantly.    History reviewed. No pertinent past medical history.  History reviewed. No pertinent past surgical history.  No family history on file.  History  Substance Use Topics  . Smoking status: Former Smoker -- 1.0 packs/day for 1.5 years    Types: Cigarettes    Quit date: 11/25/2010  . Smokeless tobacco: Never Used  . Alcohol Use: No      Review of Systems  Constitutional: Negative for fever.  HENT: Positive for dental problem.   All other systems reviewed and are negative.    Allergies  Review of patient's allergies indicates no known allergies.  Home Medications   Current Outpatient Rx  Name Route Sig Dispense Refill  . HYDROCODONE-ACETAMINOPHEN 5-325 MG PO TABS Oral Take 1 tablet by mouth every 4 (four) hours as needed. One po q 4-6 hrs prn pain    . OXYCODONE-ACETAMINOPHEN 5-325 MG PO TABS Oral Take 1 tablet by mouth every 4 (four) hours as needed. 1 po tid with food for pain if needed.    . AMOXICILLIN 500 MG PO CAPS Oral Take 1 capsule (500 mg total) by mouth 3 (three) times daily. 21 capsule 0  . OXYCODONE-ACETAMINOPHEN 5-325 MG PO TABS  One tab po q 6 hrs prn pain 20 tablet 0    BP 130/80  Pulse 75  Temp 97.5 F (36.4 C)  Resp 18  Ht 5\' 6"  (1.676 m)  Wt  150 lb (68.04 kg)  BMI 24.21 kg/m2  SpO2 99%  Physical Exam  Nursing note and vitals reviewed. Constitutional: He is oriented to person, place, and time. He appears well-developed and well-nourished.  HENT:  Head: Normocephalic and atraumatic. No trismus in the jaw.  Mouth/Throat: Uvula is midline, oropharynx is clear and moist and mucous membranes are normal. Dental caries present. No uvula swelling.       Widespread dental decay.  All teeth decayed to gumline.localizes pain to lower incisor region.  Eyes: EOM are normal.  Neck: Normal range of motion.  Cardiovascular: Normal rate, regular rhythm, normal heart sounds and intact distal pulses.   Pulmonary/Chest: Effort normal and breath sounds normal. No respiratory distress.  Abdominal: Soft. He exhibits no distension. There is no tenderness.  Musculoskeletal: Normal range of motion.  Neurological: He is alert and oriented to person, place, and time.  Skin: Skin is warm and dry.  Psychiatric: He has a normal mood and affect. Judgment normal.    ED Course  Procedures (including critical care time)  Labs Reviewed - No data to display No results found.   1. Pain, dental       MDM  Dental pain rx-amoxicillin 500 mg TID, 30 rx oxycocdone, 20 OTC ibuprofen 800 mg TID.   F/u with dr. Warren Danes as planned.  Worthy Rancher, PA 04/15/12 1032

## 2012-04-15 NOTE — Discharge Instructions (Signed)
Dental Pain A tooth ache may be caused by cavities (tooth decay). Cavities expose the nerve of the tooth to air and hot or cold temperatures. It may come from an infection or abscess (also called a boil or furuncle) around your tooth. It is also often caused by dental caries (tooth decay). This causes the pain you are having. DIAGNOSIS  Your caregiver can diagnose this problem by exam. TREATMENT   If caused by an infection, it may be treated with medications which kill germs (antibiotics) and pain medications as prescribed by your caregiver. Take medications as directed.   Only take over-the-counter or prescription medicines for pain, discomfort, or fever as directed by your caregiver.   Whether the tooth ache today is caused by infection or dental disease, you should see your dentist as soon as possible for further care.  SEEK MEDICAL CARE IF: The exam and treatment you received today has been provided on an emergency basis only. This is not a substitute for complete medical or dental care. If your problem worsens or new problems (symptoms) appear, and you are unable to meet with your dentist, call or return to this location. SEEK IMMEDIATE MEDICAL CARE IF:   You have a fever.   You develop redness and swelling of your face, jaw, or neck.   You are unable to open your mouth.   You have severe pain uncontrolled by pain medicine.  MAKE SURE YOU:   Understand these instructions.   Will watch your condition.   Will get help right away if you are not doing well or get worse.  Document Released: 11/11/2005 Document Revised: 10/31/2011 Document Reviewed: 06/29/2008 [facility] Patient Information 2012 [place], [place].    Take the meds as directed.  Take ibuprofen 800 mg every 8 hrs with food.  Follow up with dr. Warren Danes as planned.

## 2012-04-15 NOTE — ED Notes (Signed)
Pt c/o lower toothache x 3 days

## 2012-04-15 NOTE — ED Provider Notes (Signed)
Medical screening examination/treatment/procedure(s) were performed by non-physician practitioner and as supervising physician I was immediately available for consultation/collaboration.  Donshay Lupinski, MD 04/15/12 1114 

## 2012-05-31 ENCOUNTER — Encounter (HOSPITAL_COMMUNITY): Payer: Self-pay

## 2012-05-31 ENCOUNTER — Emergency Department (HOSPITAL_COMMUNITY)
Admission: EM | Admit: 2012-05-31 | Discharge: 2012-05-31 | Disposition: A | Discharge status: Home / Self Care | Payer: Self-pay | Attending: Emergency Medicine | Admitting: Emergency Medicine

## 2012-05-31 DIAGNOSIS — K029 Dental caries, unspecified: Secondary | ICD-10-CM | POA: Insufficient documentation

## 2012-05-31 DIAGNOSIS — Z87891 Personal history of nicotine dependence: Secondary | ICD-10-CM | POA: Insufficient documentation

## 2012-05-31 DIAGNOSIS — G8929 Other chronic pain: Secondary | ICD-10-CM

## 2012-05-31 MED ORDER — HYDROCODONE-ACETAMINOPHEN 5-325 MG PO TABS
1.0000 | ORAL_TABLET | Freq: Once | ORAL | Status: AC
  Administered 2012-05-31: 1 via ORAL
  Filled 2012-05-31: qty 1

## 2012-05-31 MED ORDER — HYDROCODONE-ACETAMINOPHEN 5-325 MG PO TABS: 1.0000 | ORAL_TABLET | Freq: Four times a day (QID) | ORAL | Status: AC | PRN

## 2012-05-31 MED ORDER — AMOXICILLIN 500 MG PO CAPS: 500.0000 mg | ORAL_CAPSULE | Freq: Three times a day (TID) | ORAL | Status: AC

## 2012-05-31 MED ORDER — IBUPROFEN 800 MG PO TABS
800.0000 mg | ORAL_TABLET | Freq: Once | ORAL | Status: AC
  Administered 2012-05-31: 800 mg via ORAL
  Filled 2012-05-31: qty 1

## 2012-05-31 MED ORDER — PENICILLIN V POTASSIUM 250 MG PO TABS
500.0000 mg | ORAL_TABLET | Freq: Once | ORAL | Status: AC
  Administered 2012-05-31: 500 mg via ORAL
  Filled 2012-05-31: qty 2

## 2012-05-31 NOTE — ED Provider Notes (Signed)
Medical screening examination/treatment/procedure(s) were performed by non-physician practitioner and as supervising physician I was immediately available for consultation/collaboration.   Carleene Cooper III, MD 05/31/12 [phone]

## 2012-05-31 NOTE — ED Notes (Signed)
Complain of dental pain 

## 2012-05-31 NOTE — ED Provider Notes (Signed)
History     CSN: [id number]  Arrival date & time 05/31/12  1209   First MD Initiated Contact with Patient 05/31/12 1239      Chief Complaint  Patient presents with  . Dental Pain    (Consider location/radiation/quality/duration/timing/severity/associated sxs/prior treatment) HPI Comments: Has appt with dentist in [place] on July 01, 2012.  Patient is a 31 y.o. male presenting with tooth pain. The history is provided by the patient. No language interpreter was used.  Dental PainThe primary symptoms include mouth pain. Primary symptoms do not include dental injury or fever. Episode onset: chronic pain.  waxes and wanes. The symptoms are waxing and waning.  Additional symptoms include: dental sensitivity to temperature. Additional symptoms do not include: gum swelling, trismus, facial swelling, trouble swallowing, ear pain and swollen glands.    History reviewed. No pertinent past medical history.  History reviewed. No pertinent past surgical history.  No family history on file.  History  Substance Use Topics  . Smoking status: Former Smoker -- 1.0 packs/day for 1.5 years    Types: Cigarettes    Quit date: 11/25/2010  . Smokeless tobacco: Never Used  . Alcohol Use: No      Review of Systems  Constitutional: Negative for fever and chills.  HENT: Positive for dental problem. Negative for ear pain, facial swelling and trouble swallowing.   Cardiovascular: Negative for chest pain.  All other systems reviewed and are negative.    Allergies  Review of patient's allergies indicates no known allergies.  Home Medications   Current Outpatient Rx  Name Route Sig Dispense Refill  . ACETAMINOPHEN 500 MG PO TABS Oral Take 500 mg by mouth every 6 (six) hours as needed. For pain      BP 125/77  Pulse 70  Temp 98 F (36.7 C) (Oral)  Resp 18  SpO2 100%  Physical Exam  Nursing note and vitals reviewed. Constitutional: He is oriented to person, place, and time. He appears  well-developed and well-nourished.  HENT:  Head: Normocephalic and atraumatic.  Mouth/Throat: Uvula is midline and mucous membranes are normal. Dental caries present. No dental abscesses or uvula swelling.    Eyes: EOM are normal.  Neck: Normal range of motion.  Cardiovascular: Normal rate, regular rhythm, normal heart sounds and intact distal pulses.   Pulmonary/Chest: Effort normal and breath sounds normal. No respiratory distress.  Abdominal: Soft. He exhibits no distension. There is no tenderness.  Musculoskeletal: Normal range of motion.  Neurological: He is alert and oriented to person, place, and time.  Skin: Skin is warm and dry.  Psychiatric: He has a normal mood and affect. Judgment normal.    ED Course  Procedures (including critical care time)  Labs Reviewed - No data to display No results found.   No diagnosis found.    MDM   wide spread dental decay.  No obvious abscess. rx-amoxicillin 500 mg, 30 rx-hydrocodone, 20 OTC ibuprofen 800 mg  F/u with dentist as pla nned.        Evalina Field, [place] 05/31/12 1331

## 2012-05-31 NOTE — ED Notes (Signed)
Pt c/o pain in his left lower teeth and right upper teeth. States that he has an abscess. States that pain started 2 days ago. Alert and oriented x 3. Skin warm and dry. Color pink.

## 2012-08-06 ENCOUNTER — Emergency Department (HOSPITAL_COMMUNITY)
Admission: EM | Admit: 2012-08-06 | Discharge: 2012-08-06 | Disposition: A | Discharge status: Home / Self Care | Payer: Self-pay | Attending: Emergency Medicine | Admitting: Emergency Medicine

## 2012-08-06 ENCOUNTER — Encounter (HOSPITAL_COMMUNITY): Payer: Self-pay | Admitting: *Deleted

## 2012-08-06 DIAGNOSIS — K029 Dental caries, unspecified: Secondary | ICD-10-CM | POA: Insufficient documentation

## 2012-08-06 DIAGNOSIS — Z87891 Personal history of nicotine dependence: Secondary | ICD-10-CM | POA: Insufficient documentation

## 2012-08-06 DIAGNOSIS — K0889 Other specified disorders of teeth and supporting structures: Secondary | ICD-10-CM

## 2012-08-06 MED ORDER — HYDROCODONE-ACETAMINOPHEN 5-325 MG PO TABS
1.0000 | ORAL_TABLET | Freq: Once | ORAL | Status: AC
  Administered 2012-08-06: 1 via ORAL
  Filled 2012-08-06: qty 1

## 2012-08-06 MED ORDER — TRAMADOL HCL 50 MG PO TABS: 50.0000 mg | ORAL_TABLET | Freq: Four times a day (QID) | ORAL | Status: AC | PRN

## 2012-08-06 MED ORDER — PENICILLIN V POTASSIUM 500 MG PO TABS: 500.0000 mg | ORAL_TABLET | Freq: Four times a day (QID) | ORAL | Status: AC

## 2012-08-06 NOTE — ED Notes (Signed)
Pt c/o pain to left lower jaw and right upper jaw. Pt states pain began 2 days ago

## 2012-08-06 NOTE — ED Provider Notes (Cosign Needed)
History   This chart was scribed for Benny Lennert, MD by Sofie Rower. The patient was seen in room [facility]12/[facility]12 and the patient's care was started at 7:17AM.     CSN: [id number]  Arrival date & time 08/06/12  0644   First MD Initiated Contact with Patient 08/06/12 [phone]      Chief Complaint  Patient presents with  . Dental Pain    (Consider location/radiation/quality/duration/timing/severity/associated sxs/prior treatment) Patient is a 31 y.o. male presenting with tooth pain. The history is provided by the patient. No language interpreter was used.  Dental PainThe primary symptoms include mouth pain. Primary symptoms do not include dental injury, sore throat or cough. The symptoms began 2 days ago. The symptoms are worsening. The symptoms are new. The symptoms occur constantly.  Mouth pain began 24 -48 hours ago. Mouth pain occurs constantly. Mouth pain is unchanged. Affected locations include: teeth.  Additional symptoms include: jaw pain. Additional symptoms do not include: ear pain and hearing loss.    History reviewed. No pertinent past medical history.  History reviewed. No pertinent past surgical history.  History reviewed. No pertinent family history.  History  Substance Use Topics  . Smoking status: Former Smoker -- 1.0 packs/day for 1.5 years    Types: Cigarettes    Quit date: 11/25/2010  . Smokeless tobacco: Never Used  . Alcohol Use: No      Review of Systems  HENT: Negative for hearing loss, ear pain and sore throat.   Respiratory: Negative for cough.   All other systems reviewed and are negative.    Allergies  Review of patient's allergies indicates no known allergies.  Home Medications   Current Outpatient Rx  Name Route Sig Dispense Refill  . ACETAMINOPHEN 500 MG PO TABS Oral Take 500 mg by mouth every 6 (six) hours as needed. For pain      BP 144/85  Pulse 85  Temp 97.9 F (36.6 C) (Oral)  Resp 18  Ht 5\' 6"  (1.676 m)  Wt 150 lb (68.04 kg)   BMI 24.21 kg/m2  SpO2 98%  Physical Exam  Nursing note and vitals reviewed. Constitutional: He is oriented to person, place, and time. He appears well-developed.  HENT:  Head: Normocephalic.       Pt with extremely poor dentition. Multiple teeth with decay detected.   Eyes: Conjunctivae normal are normal.  Neck: No tracheal deviation present.  Cardiovascular:  No murmur heard. Musculoskeletal: Normal range of motion.  Neurological: He is oriented to person, place, and time.  Skin: Skin is warm.  Psychiatric: He has a normal mood and affect.    ED Course  Procedures (including critical care time)  DIAGNOSTIC STUDIES: Oxygen Saturation is 98% on room air, normal by my interpretation.    COORDINATION OF CARE:    7:36AM- Application of antibiotics discussed. Pt agrees with treatment.   Labs Reviewed - No data to display No results found.   No diagnosis found.    MDM        The chart was scribed for me under my direct supervision.  I personally performed the history, physical, and medical decision making and all procedures in the evaluation of this patient.Benny Lennert, MD 08/06/12 [phone]

## 2012-10-17 ENCOUNTER — Encounter (HOSPITAL_COMMUNITY): Payer: Self-pay | Admitting: Emergency Medicine

## 2012-10-17 ENCOUNTER — Emergency Department (HOSPITAL_COMMUNITY)
Admission: EM | Admit: 2012-10-17 | Discharge: 2012-10-17 | Disposition: A | Discharge status: Home / Self Care | Payer: Self-pay | Attending: Emergency Medicine | Admitting: Emergency Medicine

## 2012-10-17 DIAGNOSIS — Z87891 Personal history of nicotine dependence: Secondary | ICD-10-CM | POA: Insufficient documentation

## 2012-10-17 DIAGNOSIS — K0889 Other specified disorders of teeth and supporting structures: Secondary | ICD-10-CM

## 2012-10-17 DIAGNOSIS — K089 Disorder of teeth and supporting structures, unspecified: Secondary | ICD-10-CM | POA: Insufficient documentation

## 2012-10-17 MED ORDER — AMOXICILLIN 500 MG PO CAPS: 500.0000 mg | ORAL_CAPSULE | Freq: Three times a day (TID) | ORAL | Status: DC

## 2012-10-17 MED ORDER — IBUPROFEN 800 MG PO TABS: 800.0000 mg | ORAL_TABLET | Freq: Three times a day (TID) | ORAL | Status: DC

## 2012-10-17 MED ORDER — HYDROCODONE-ACETAMINOPHEN 7.5-500 MG/15ML PO SOLN: 7.5000 mL | Freq: Four times a day (QID) | ORAL | Status: DC | PRN

## 2012-10-17 NOTE — ED Notes (Signed)
Pt complains of dental pain, states he has an abscessed tooth to Left upper back tooth and lower right back tooth. Pt states pain x3 days. No other complaints

## 2012-10-17 NOTE — ED Provider Notes (Signed)
History     CSN: [id number]  Arrival date & time 10/17/12  2440   First MD Initiated Contact with Patient 10/17/12 [phone]      Chief Complaint  Patient presents with  . Dental Pain    (Consider location/radiation/quality/duration/timing/severity/associated sxs/prior treatment) HPI History provided by patient. Dental pain to left upper and right lower. Onset 3 days ago. No fevers. Pain is sharp in quality. Hurts to chew. No trouble breathing or swallowing. No sore throat. No rash. No facial swelling. Pain is moderate to severe. Has not tried any home. has a Education officer, community, Dr. Montez Morita in [place]. History of dental infections in the past has had dental extractions. History reviewed. No pertinent past medical history.  History reviewed. No pertinent past surgical history.  History reviewed. No pertinent family history.  History  Substance Use Topics  . Smoking status: Former Smoker -- 1.0 packs/day for 1.5 years    Types: Cigarettes    Quit date: 11/25/2010  . Smokeless tobacco: Never Used  . Alcohol Use: No      Review of Systems  Constitutional: Negative for fever and chills.  HENT: Positive for dental problem. Negative for neck pain and neck stiffness.   Eyes: Negative for pain.  Respiratory: Negative for shortness of breath.   Cardiovascular: Negative for chest pain.  Gastrointestinal: Negative for abdominal pain.  Genitourinary: Negative for dysuria.  Musculoskeletal: Negative for back pain.  Skin: Negative for rash.  Neurological: Negative for headaches.  All other systems reviewed and are negative.    Allergies  Review of patient's allergies indicates no known allergies.  Home Medications   Current Outpatient Rx  Name  Route  Sig  Dispense  Refill  . ACETAMINOPHEN 500 MG PO TABS   Oral   Take 500 mg by mouth every 6 (six) hours as needed. For pain         . AMOXICILLIN 500 MG PO CAPS   Oral   Take 1 capsule (500 mg total) by mouth 3 (three) times daily.   21 capsule   0   . HYDROCODONE-ACETAMINOPHEN 7.5-500 MG/15ML PO SOLN   Oral   Take 7.5 mLs by mouth every 6 (six) hours as needed for pain.   30 mL   0   . IBUPROFEN 800 MG PO TABS   Oral   Take 1 tablet (800 mg total) by mouth 3 (three) times daily.   21 tablet   0     BP 137/74  Pulse 85  Temp 97.5 F (36.4 C) (Oral)  Resp 17  Ht 5\' 6"  (1.676 m)  Wt 150 lb (68.04 kg)  BMI 24.21 kg/m2  SpO2 99%  Physical Exam  Constitutional: He is oriented to person, place, and time. He appears well-developed and well-nourished.  HENT:  Head: Normocephalic and atraumatic.       Uvula midline. No trismus. No facial swelling or erythema. Poor dentition. No gingival swelling, fluctuance, erythema or pointing. No point tenderness with localized tenderness to left upper and right lower jaw.  Eyes: EOM are normal. Pupils are equal, round, and reactive to light.  Neck: Neck supple.  Cardiovascular: Regular rhythm and intact distal pulses.   Pulmonary/Chest: Effort normal. No respiratory distress.  Musculoskeletal: Normal range of motion. He exhibits no edema.  Neurological: He is alert and oriented to person, place, and time.  Skin: Skin is warm and dry.    ED Course  Procedures (including critical care time)    1. Pain, dental  Patient declines dental block.  Prescriptions for analgesia and antibiotics provided. Patient encouraged to see his dentist in one week for recheck and further evaluation. Patient encouraged to stop smoking. infx precautions verbalized as understood.  MDM   Dental pain. Declines dental block. Prescriptions provided. Vital signs and nursing notes reviewed and considered.        Sunnie Nielsen, MD 10/17/12 [phone]

## 2012-10-29 ENCOUNTER — Emergency Department (HOSPITAL_COMMUNITY)
Admission: EM | Admit: 2012-10-29 | Discharge: 2012-10-29 | Disposition: A | Discharge status: Home / Self Care | Payer: Self-pay | Attending: Emergency Medicine | Admitting: Emergency Medicine

## 2012-10-29 ENCOUNTER — Encounter (HOSPITAL_COMMUNITY): Payer: Self-pay | Admitting: Emergency Medicine

## 2012-10-29 DIAGNOSIS — Z87891 Personal history of nicotine dependence: Secondary | ICD-10-CM | POA: Insufficient documentation

## 2012-10-29 DIAGNOSIS — K047 Periapical abscess without sinus: Secondary | ICD-10-CM | POA: Insufficient documentation

## 2012-10-29 MED ORDER — IBUPROFEN 800 MG PO TABS: 800.0000 mg | ORAL_TABLET | Freq: Three times a day (TID) | ORAL | Status: DC

## 2012-10-29 MED ORDER — AMOXICILLIN 250 MG PO CAPS
500.0000 mg | ORAL_CAPSULE | Freq: Once | ORAL | Status: AC
  Administered 2012-10-29: 500 mg via ORAL
  Filled 2012-10-29: qty 2

## 2012-10-29 MED ORDER — OXYCODONE-ACETAMINOPHEN 5-325 MG PO TABS
2.0000 | ORAL_TABLET | Freq: Once | ORAL | Status: AC
  Administered 2012-10-29: 2 via ORAL
  Filled 2012-10-29: qty 2

## 2012-10-29 MED ORDER — IBUPROFEN 800 MG PO TABS
800.0000 mg | ORAL_TABLET | Freq: Once | ORAL | Status: AC
  Administered 2012-10-29: 800 mg via ORAL
  Filled 2012-10-29: qty 1

## 2012-10-29 MED ORDER — HYDROCODONE-ACETAMINOPHEN 5-500 MG PO TABS: 1.0000 | ORAL_TABLET | Freq: Four times a day (QID) | ORAL | Status: DC | PRN

## 2012-10-29 MED ORDER — AMOXICILLIN 500 MG PO CAPS: 500.0000 mg | ORAL_CAPSULE | Freq: Three times a day (TID) | ORAL | Status: DC

## 2012-10-29 NOTE — ED Notes (Signed)
Pt states he has a tooth that he was seen here previously for a week ago.  He was given a prescription but could not afford to get it filled and the pharmacy advised him to be reevaluated to receive a different prescription.

## 2012-10-29 NOTE — ED Provider Notes (Signed)
History     CSN: [id number]  Arrival date & time 10/29/12  6962   First MD Initiated Contact with Patient 10/29/12 [phone]      Chief Complaint  Patient presents with  . Dental Pain    (Consider location/radiation/quality/duration/timing/severity/associated sxs/prior treatment) HPI History provided by patient. Ongoing right lower dental pain now with facial swelling. No fevers or vomiting. Pain is sharp in quality and not radiating. History of same with very poor dentition. Patient states he is seeing a dentist but told it require $8000 cash to have all of his teeth pulled. He was evaluated here last week and given prescriptions. He took prescriptions to the pharmacy at [facility] was not able to afford narcotics and presents here requesting a different narcotic prescription. He was told he could not fill the Motrin or the antibiotics until everything is filled. Patient adamantly declines any dental injections and states he has to be put to sleep to have any needles or shots History reviewed. No pertinent past medical history.  History reviewed. No pertinent past surgical history.  No family history on file.  History  Substance Use Topics  . Smoking status: Former Smoker -- 1.0 packs/day for 1.5 years    Types: Cigarettes    Quit date: 11/25/2010  . Smokeless tobacco: Never Used  . Alcohol Use: No      Review of Systems  Constitutional: Negative for fever and chills.  HENT: Positive for dental problem. Negative for neck pain and neck stiffness.   Eyes: Negative for pain.  Respiratory: Negative for shortness of breath.   Cardiovascular: Negative for chest pain.  Gastrointestinal: Negative for abdominal pain.  Genitourinary: Negative for dysuria.  Musculoskeletal: Negative for back pain.  Skin: Negative for rash.  Neurological: Negative for headaches.  All other systems reviewed and are negative.    Allergies  Review of patient's allergies indicates no known  allergies.  Home Medications   Current Outpatient Rx  Name  Route  Sig  Dispense  Refill  . ACETAMINOPHEN 500 MG PO TABS   Oral   Take 500 mg by mouth every 6 (six) hours as needed. For pain         . AMOXICILLIN 500 MG PO CAPS   Oral   Take 1 capsule (500 mg total) by mouth 3 (three) times daily.   21 capsule   0   . HYDROCODONE-ACETAMINOPHEN 7.5-500 MG/15ML PO SOLN   Oral   Take 7.5 mLs by mouth every 6 (six) hours as needed for pain.   30 mL   0   . IBUPROFEN 800 MG PO TABS   Oral   Take 1 tablet (800 mg total) by mouth 3 (three) times daily.   21 tablet   0     BP 125/78  Pulse 91  Temp 98.1 F (36.7 C) (Oral)  Resp 16  Ht 5\' 6"  (1.676 m)  Wt 150 lb (68.04 kg)  BMI 24.21 kg/m2  SpO2 98%  Physical Exam  Constitutional: He is oriented to person, place, and time. He appears well-developed and well-nourished.  HENT:  Head: Normocephalic and atraumatic.       Very poor dentition throughout. No trismus. Tender right lower molar with associated mild facial swelling. No pointing. No facial erythema  Eyes: EOM are normal. Pupils are equal, round, and reactive to light.  Neck: Neck supple.  Cardiovascular: Regular rhythm and intact distal pulses.   Pulmonary/Chest: Effort normal. No respiratory distress.  Musculoskeletal: Normal range of motion.  He exhibits no edema.  Lymphadenopathy:    He has no cervical adenopathy.  Neurological: He is alert and oriented to person, place, and time.  Skin: Skin is warm and dry.    ED Course  Procedures (including critical care time)  Patient declines any dental injection  1. Dental abscess    PO antibiotics and pain medications provided   MDM   Dental pain and abscess. Free dental clinic referral provided. Other local dental referrals provided  Prescriptions provided. Patient states understanding strict return precautions and need for followup     Sunnie Nielsen, MD 10/29/12 [phone]

## 2012-12-26 ENCOUNTER — Encounter (HOSPITAL_COMMUNITY): Payer: Self-pay | Admitting: *Deleted

## 2012-12-26 ENCOUNTER — Emergency Department (HOSPITAL_COMMUNITY)
Admission: EM | Admit: 2012-12-26 | Discharge: 2012-12-26 | Disposition: A | Discharge status: Home / Self Care | Payer: Self-pay | Attending: Emergency Medicine | Admitting: Emergency Medicine

## 2012-12-26 DIAGNOSIS — R51 Headache: Secondary | ICD-10-CM | POA: Insufficient documentation

## 2012-12-26 DIAGNOSIS — K0889 Other specified disorders of teeth and supporting structures: Secondary | ICD-10-CM

## 2012-12-26 DIAGNOSIS — K089 Disorder of teeth and supporting structures, unspecified: Secondary | ICD-10-CM | POA: Insufficient documentation

## 2012-12-26 DIAGNOSIS — K029 Dental caries, unspecified: Secondary | ICD-10-CM | POA: Insufficient documentation

## 2012-12-26 DIAGNOSIS — R599 Enlarged lymph nodes, unspecified: Secondary | ICD-10-CM | POA: Insufficient documentation

## 2012-12-26 DIAGNOSIS — R220 Localized swelling, mass and lump, head: Secondary | ICD-10-CM | POA: Insufficient documentation

## 2012-12-26 DIAGNOSIS — Z87891 Personal history of nicotine dependence: Secondary | ICD-10-CM | POA: Insufficient documentation

## 2012-12-26 MED ORDER — AMOXICILLIN 500 MG PO CAPS: 500.0000 mg | ORAL_CAPSULE | Freq: Three times a day (TID) | ORAL | Status: DC

## 2012-12-26 MED ORDER — OXYCODONE-ACETAMINOPHEN 5-325 MG PO TABS: 1.0000 | ORAL_TABLET | ORAL | Status: DC | PRN

## 2012-12-26 NOTE — ED Notes (Signed)
Pt states abscessed tooth. Pain x 4 days

## 2012-12-26 NOTE — ED Notes (Signed)
Patient with no complaints at this time. Respirations even and unlabored. Skin warm/dry. Discharge instructions reviewed with patient at this time. Stressed importance of taking antibx for full Rx.   Patient given opportunity to voice concerns/ask questions.  Patient discharged at this time and left Emergency Department with steady gait.

## 2012-12-26 NOTE — ED Provider Notes (Signed)
Medical screening examination/treatment/procedure(s) were performed by non-physician practitioner and as supervising physician I was immediately available for consultation/collaboration.   Lyanne Co, MD 12/26/12 [phone]

## 2012-12-26 NOTE — ED Provider Notes (Signed)
History     CSN: [id number]  Arrival date & time 12/26/12  1457   None     Chief Complaint  Patient presents with  . Dental Pain    HPI Johnathan Welch is a 32 y.o. male who presents to the ED with dental pain. The pain started   He describes the pain as  He rates the pain as    The pain is located    History reviewed. No pertinent past medical history.  History reviewed. No pertinent past surgical history.  No family history on file.  History  Substance Use Topics  . Smoking status: Former Smoker -- 1.0 packs/day for 1.5 years    Types: Cigarettes    Quit date: 11/25/2010  . Smokeless tobacco: Never Used  . Alcohol Use: No      Review of Systems  Constitutional: Negative for fever, chills and diaphoresis.  HENT: Positive for facial swelling. Negative for ear pain, congestion, sore throat, neck pain, neck stiffness, dental problem and sinus pressure.   Eyes: Negative.   Respiratory: Negative for cough, chest tightness and wheezing.   Cardiovascular: Negative for chest pain.  Gastrointestinal: Negative for nausea, vomiting and abdominal pain.  Genitourinary: Negative for frequency.  Musculoskeletal: Negative for myalgias, back pain and gait problem.  Skin: Negative for color change and rash.  Neurological: Positive for headaches. Negative for dizziness and light-headedness.  Psychiatric/Behavioral: Negative for confusion and agitation.    Allergies  Review of patient's allergies indicates no known allergies.  Home Medications  No current outpatient prescriptions on file.  BP 139/80  Pulse 66  Temp 97.7 F (36.5 C) (Oral)  Resp 18  Ht 5\' 6"  (1.676 m)  Wt 150 lb (68.04 kg)  BMI 24.21 kg/m2  SpO2 98%  Physical Exam  Nursing note and vitals reviewed. Constitutional: He is oriented to person, place, and time. He appears well-developed and well-nourished. No distress.  HENT:  Head: Normocephalic and atraumatic.  Right Ear: Tympanic membrane normal.    Left Ear: Tympanic membrane normal.  Mouth/Throat: Uvula is midline, oropharynx is clear and moist and mucous membranes are normal. Dental caries present.         Multiple caries with erythema and tenderness of the gum surrounding the teeth.   Eyes: EOM are normal. Pupils are equal, round, and reactive to light.  Neck: Neck supple.  Cardiovascular: Normal rate.   Pulmonary/Chest: Effort normal.  Abdominal: Soft. There is no tenderness.  Musculoskeletal: Normal range of motion. He exhibits no edema.  Lymphadenopathy:    He has cervical adenopathy.  Neurological: He is alert and oriented to person, place, and time. No cranial nerve deficit.  Skin: Skin is warm and dry.  Psychiatric: He has a normal mood and affect. His behavior is normal. Judgment and thought content normal.   Procedures   Assessment: 32 y.o. male with multiple dental caries    Plan:  Treat with antibiotics   Pain management   Follow up with dentis as soon as possible.  Discussed with the patient and all questioned fully answered.    Medication List     As of 12/26/2012  3:40 PM    START taking these medications         amoxicillin 500 MG capsule   Commonly known as: AMOXIL   Take 1 capsule (500 mg total) by mouth 3 (three) times daily.      oxyCODONE-acetaminophen 5-325 MG per tablet   Commonly known as: PERCOCET/ROXICET  Take 1 tablet by mouth every 4 (four) hours as needed for pain.          Where to get your medications    These are the prescriptions that you need to pick up.   You may get these medications from any pharmacy.         amoxicillin 500 MG capsule   oxyCODONE-acetaminophen 5-325 MG per tablet                Janne Napoleon, NP 12/26/12 1540

## 2013-01-17 ENCOUNTER — Emergency Department (HOSPITAL_COMMUNITY)
Admission: EM | Admit: 2013-01-17 | Discharge: 2013-01-17 | Disposition: A | Discharge status: Home / Self Care | Payer: Self-pay | Attending: Emergency Medicine | Admitting: Emergency Medicine

## 2013-01-17 ENCOUNTER — Encounter (HOSPITAL_COMMUNITY): Payer: Self-pay

## 2013-01-17 DIAGNOSIS — R52 Pain, unspecified: Secondary | ICD-10-CM

## 2013-01-17 DIAGNOSIS — A630 Anogenital (venereal) warts: Secondary | ICD-10-CM | POA: Insufficient documentation

## 2013-01-17 DIAGNOSIS — F17200 Nicotine dependence, unspecified, uncomplicated: Secondary | ICD-10-CM | POA: Insufficient documentation

## 2013-01-17 LAB — CBC WITH DIFFERENTIAL/PLATELET
Eosinophils Relative: 3 % (ref 0–5)
HCT: 38 % — ABNORMAL LOW (ref 39.0–52.0)
Lymphocytes Relative: 25 % (ref 12–46)
Lymphs Abs: 3 10*3/uL (ref 0.7–4.0)
MCV: 62.9 fL — ABNORMAL LOW (ref 78.0–100.0)
Monocytes Relative: 8 % (ref 3–12)
Neutro Abs: 7.6 10*3/uL (ref 1.7–7.7)
Platelets: 329 10*3/uL (ref 150–400)
RBC: 6.04 MIL/uL — ABNORMAL HIGH (ref 4.22–5.81)
WBC: 12.1 10*3/uL — ABNORMAL HIGH (ref 4.0–10.5)

## 2013-01-17 LAB — BASIC METABOLIC PANEL
CO2: 27 mEq/L (ref 19–32)
Chloride: 100 mEq/L (ref 96–112)
Creatinine, Ser: 0.79 mg/dL (ref 0.50–1.35)
Potassium: 3.9 mEq/L (ref 3.5–5.1)

## 2013-01-17 MED ORDER — PROMETHAZINE HCL 12.5 MG PO TABS
12.5000 mg | ORAL_TABLET | Freq: Once | ORAL | Status: AC
  Administered 2013-01-17: 12.5 mg via ORAL
  Filled 2013-01-17: qty 1

## 2013-01-17 MED ORDER — KETOROLAC TROMETHAMINE 10 MG PO TABS
10.0000 mg | ORAL_TABLET | Freq: Once | ORAL | Status: AC
  Administered 2013-01-17: 10 mg via ORAL
  Filled 2013-01-17: qty 1

## 2013-01-17 MED ORDER — AMOXICILLIN 500 MG PO CAPS: 500.0000 mg | ORAL_CAPSULE | Freq: Three times a day (TID) | ORAL | Status: DC

## 2013-01-17 MED ORDER — SULFAMETHOXAZOLE-TRIMETHOPRIM 800-160 MG PO TABS: ORAL_TABLET | ORAL | Status: DC

## 2013-01-17 MED ORDER — HYDROCODONE-ACETAMINOPHEN 5-325 MG PO TABS: 1.0000 | ORAL_TABLET | ORAL | Status: DC | PRN

## 2013-01-17 MED ORDER — HYDROCODONE-ACETAMINOPHEN 5-325 MG PO TABS
2.0000 | ORAL_TABLET | Freq: Once | ORAL | Status: AC
  Administered 2013-01-17: 2 via ORAL
  Filled 2013-01-17: qty 2

## 2013-01-17 MED ORDER — SULFAMETHOXAZOLE-TMP DS 800-160 MG PO TABS
1.0000 | ORAL_TABLET | Freq: Once | ORAL | Status: AC
Start: 2013-01-17 — End: 2013-01-17
  Administered 2013-01-17: 1 via ORAL
  Filled 2013-01-17: qty 1

## 2013-01-17 MED ORDER — PENICILLIN V POTASSIUM 250 MG PO TABS
500.0000 mg | ORAL_TABLET | Freq: Once | ORAL | Status: AC
  Administered 2013-01-17: 500 mg via ORAL
  Filled 2013-01-17: qty 2

## 2013-01-17 NOTE — ED Notes (Signed)
Patient with no complaints at this time. Respirations even and unlabored. Skin warm/dry. Discharge instructions reviewed with patient at this time. Patient given opportunity to voice concerns/ask questions. Patient discharged at this time and left Emergency Department with steady gait.   

## 2013-01-17 NOTE — ED Provider Notes (Signed)
History     CSN: [id number]  Arrival date & time 01/17/13  0911   First MD Initiated Contact with Patient 01/17/13 [phone]      Chief Complaint  Patient presents with  . Abscess    (Consider location/radiation/quality/duration/timing/severity/associated sxs/prior treatment) Patient is a 32 y.o. male presenting with abscess. The history is provided by the patient.  Abscess Location:  Ano-genital Ano-genital abscess location:  Perineum Abscess quality: fluctuance, painful and redness   Red streaking: no   Progression:  Worsening Pain details:    Quality:  Sharp   Severity:  Moderate   Timing:  Constant   Progression:  Worsening Chronicity:  Recurrent Context: not diabetes and not immunosuppression   Relieved by:  Nothing Exacerbated by: palpation. Associated symptoms: no anorexia, no fatigue, no fever, no nausea and no vomiting     History reviewed. No pertinent past medical history.  History reviewed. No pertinent past surgical history.  No family history on file.  History  Substance Use Topics  . Smoking status: Current Every Day Smoker -- 1.00 packs/day for 1.5 years    Types: Cigarettes    Last Attempt to Quit: 11/25/2010  . Smokeless tobacco: Never Used  . Alcohol Use: No      Review of Systems  Constitutional: Negative for fever, activity change and fatigue.       All ROS Neg except as noted in HPI  HENT: Negative for nosebleeds and neck pain.   Eyes: Negative for photophobia and discharge.  Respiratory: Negative for cough, shortness of breath and wheezing.   Cardiovascular: Negative for chest pain and palpitations.  Gastrointestinal: Negative for nausea, vomiting, abdominal pain, blood in stool and anorexia.  Genitourinary: Negative for dysuria, frequency and hematuria.  Musculoskeletal: Negative for back pain and arthralgias.  Skin: Negative.   Neurological: Negative for dizziness, seizures and speech difficulty.  Psychiatric/Behavioral: Negative for  hallucinations and confusion.    Allergies  Review of patient's allergies indicates no known allergies.  Home Medications  No current outpatient prescriptions on file.  BP 155/98  Pulse 80  Temp(Src) 97.5 F (36.4 C) (Oral)  Resp 18  Ht 5\' 6"  (1.676 m)  Wt 160 lb (72.576 kg)  BMI 25.84 kg/m2  SpO2 98%  Physical Exam  ED Course  Procedures (including critical care time)  Labs Reviewed - No data to display No results found.   No diagnosis found.    MDM  I have reviewed nursing notes, vital signs, and all appropriate lab and imaging results for this patient.  patient seen with me by Dr. Adriana Simas. It is our opinion that this is a wart. There is a firm tender area under the wart which raises some concern. The basic metabolic panel is well within normal limits. The complete blood count shows a white blood cell count 12,100 the hemoglobin is 12 slightly low, the hematocrit is 38 also slightly low.  I have arranged for the patient to have an ultrasound of the scrotum to evaluate the firm area/mass just below the scrotum on the left. Patient agrees to have this evaluated further. In the interim we will treat the patient with amoxicillin and Septra. Patient used given Norco one every 4 hours for pain. Patient is to return if any changes, problems, or concerns.       Kathie Dike, [place] 01/19/13 [phone]

## 2013-01-17 NOTE — ED Notes (Signed)
Pt reports has had red area under scrotum x 1 year.  Reports has been sore for 1 month but has had severe pain x 1 week.

## 2013-01-18 ENCOUNTER — Other Ambulatory Visit (HOSPITAL_COMMUNITY): Payer: Self-pay | Admitting: Physician Assistant

## 2013-01-18 ENCOUNTER — Ambulatory Visit (HOSPITAL_COMMUNITY): Admit: 2013-01-18 | Payer: Self-pay

## 2013-01-18 DIAGNOSIS — R52 Pain, unspecified: Secondary | ICD-10-CM

## 2013-01-19 NOTE — ED Provider Notes (Signed)
Medical screening examination/treatment/procedure(s) were conducted as a shared visit with non-physician practitioner(s) and myself.  I personally evaluated the patient during the encounter.  History and physical consistent with wart.  Treatment plan per physician's assistant  Donnetta Hutching, MD 01/19/13 [phone]

## 2013-03-09 ENCOUNTER — Encounter (HOSPITAL_COMMUNITY): Payer: Self-pay | Admitting: Emergency Medicine

## 2013-03-09 ENCOUNTER — Emergency Department (HOSPITAL_COMMUNITY)
Admission: EM | Admit: 2013-03-09 | Discharge: 2013-03-09 | Disposition: A | Discharge status: Home / Self Care | Payer: Self-pay | Attending: Emergency Medicine | Admitting: Emergency Medicine

## 2013-03-09 DIAGNOSIS — K089 Disorder of teeth and supporting structures, unspecified: Secondary | ICD-10-CM | POA: Insufficient documentation

## 2013-03-09 DIAGNOSIS — K0889 Other specified disorders of teeth and supporting structures: Secondary | ICD-10-CM

## 2013-03-09 DIAGNOSIS — K08109 Complete loss of teeth, unspecified cause, unspecified class: Secondary | ICD-10-CM | POA: Insufficient documentation

## 2013-03-09 DIAGNOSIS — F17200 Nicotine dependence, unspecified, uncomplicated: Secondary | ICD-10-CM | POA: Insufficient documentation

## 2013-03-09 MED ORDER — OXYCODONE-ACETAMINOPHEN 5-325 MG PO TABS
1.0000 | ORAL_TABLET | Freq: Once | ORAL | Status: AC
  Administered 2013-03-09: 1 via ORAL
  Filled 2013-03-09: qty 1

## 2013-03-09 MED ORDER — PENICILLIN V POTASSIUM 500 MG PO TABS: 500.0000 mg | ORAL_TABLET | Freq: Four times a day (QID) | ORAL | Status: AC

## 2013-03-09 MED ORDER — OXYCODONE-ACETAMINOPHEN 5-325 MG PO TABS: 1.0000 | ORAL_TABLET | Freq: Four times a day (QID) | ORAL | Status: DC | PRN

## 2013-03-09 NOTE — ED Provider Notes (Signed)
History    This chart was scribed for Benny Lennert, MD by Quintella Reichert, ED scribe.  This patient was seen in room [facility] and the patient's care was started at 7:25 AM.   CSN: [id number]  Arrival date & time 03/09/13  3086      Chief Complaint  Patient presents with  . Dental Pain     Patient is a 32 y.o. male presenting with tooth pain. The history is provided by the patient. No language interpreter was used.  Dental PainThe primary symptoms include mouth pain. Primary symptoms do not include headaches or cough. The symptoms began more than 1 month ago. The symptoms are unchanged. The symptoms are chronic. The symptoms occur constantly.  Additional symptoms do not include: fatigue.   Johnathan Welch is a 33 y.o. male who presents to the Emergency Department complaining of constant, severe dental pain that began over one month ago.  He visited the ED for the same symptom in 2/14, and states he is scheduled to have all of his teeth removed in the swollen area in May 2014.  He is not taking medication.  Pt denies fever, cold, cough, or sore throat.   History reviewed. No pertinent past medical history.  History reviewed. No pertinent past surgical history.  No family history on file.  History  Substance Use Topics  . Smoking status: Current Every Day Smoker -- 0.50 packs/day for 1.5 years    Types: Cigarettes    Last Attempt to Quit: 11/25/2010  . Smokeless tobacco: Never Used  . Alcohol Use: No      Review of Systems  Constitutional: Negative for appetite change and fatigue.  HENT: Positive for dental problem. Negative for congestion, sinus pressure and ear discharge.   Eyes: Negative for discharge.  Respiratory: Negative for cough.   Cardiovascular: Negative for chest pain.  Gastrointestinal: Negative for abdominal pain and diarrhea.  Genitourinary: Negative for frequency and hematuria.  Musculoskeletal: Negative for back pain.  Skin: Negative for rash.   Neurological: Negative for seizures and headaches.  Psychiatric/Behavioral: Negative for hallucinations.    Allergies  Review of patient's allergies indicates no known allergies.  Home Medications   Current Outpatient Rx  Name  Route  Sig  Dispense  Refill  . amoxicillin (AMOXIL) 500 MG capsule   Oral   Take 1 capsule (500 mg total) by mouth 3 (three) times daily.   21 capsule   0   . HYDROcodone-acetaminophen (NORCO/VICODIN) 5-325 MG per tablet   Oral   Take 1 tablet by mouth every 4 (four) hours as needed for pain.   15 tablet   0   . sulfamethoxazole-trimethoprim (SEPTRA DS) 800-160 MG per tablet      1 po bid with food   14 tablet   0     BP 131/70  Pulse 64  Temp(Src) 98.4 F (36.9 C) (Oral)  Resp 16  SpO2 100%  Physical Exam  Constitutional: He is oriented to person, place, and time. He appears well-developed.  HENT:  Head: Normocephalic.  All upper teeth broken, gum inflamed on right side.  Eyes: Conjunctivae and EOM are normal. No scleral icterus.  Neck: Neck supple. No tracheal deviation present. No thyromegaly present.  Cardiovascular: Normal rate.   Pulmonary/Chest: No stridor.  Musculoskeletal: Normal range of motion. He exhibits no edema.  Lymphadenopathy:    He has no cervical adenopathy.  Neurological: He is oriented to person, place, and time. Coordination normal.  Skin: Skin is  warm. No rash noted. No erythema.  Psychiatric: He has a normal mood and affect. His behavior is normal.    ED Course  Procedures (including critical care time)  DIAGNOSTIC STUDIES: Oxygen Saturation is 100% on room air, normal by my interpretation.    COORDINATION OF CARE: 7:27 AM-Discussed treatment plan which includes pain medication and antibiotics with pt at bedside and pt agreed to plan.      Labs Reviewed - No data to display No results found.   No diagnosis found.    MDM        The chart was scribed for me under my direct supervision.   I personally performed the history, physical, and medical decision making and all procedures in the evaluation of this patient.Benny Lennert, MD 03/09/13 [phone]

## 2013-03-09 NOTE — ED Notes (Signed)
Pt complains of dental pain, states he is scheduled to have several teeth extracted in a couple weeks. States pain and swelling now, mild swelling noted.

## 2013-03-26 ENCOUNTER — Emergency Department (HOSPITAL_COMMUNITY)
Admission: EM | Admit: 2013-03-26 | Discharge: 2013-03-26 | Disposition: A | Discharge status: Home / Self Care | Payer: Self-pay | Attending: Emergency Medicine | Admitting: Emergency Medicine

## 2013-03-26 ENCOUNTER — Encounter (HOSPITAL_COMMUNITY): Payer: Self-pay | Admitting: *Deleted

## 2013-03-26 DIAGNOSIS — K029 Dental caries, unspecified: Secondary | ICD-10-CM | POA: Insufficient documentation

## 2013-03-26 DIAGNOSIS — F17200 Nicotine dependence, unspecified, uncomplicated: Secondary | ICD-10-CM | POA: Insufficient documentation

## 2013-03-26 MED ORDER — AMOXICILLIN 500 MG PO CAPS: 500.0000 mg | ORAL_CAPSULE | Freq: Three times a day (TID) | ORAL | Status: DC

## 2013-03-26 MED ORDER — OXYCODONE-ACETAMINOPHEN 5-325 MG PO TABS: 2.0000 | ORAL_TABLET | ORAL | Status: DC | PRN

## 2013-03-26 NOTE — ED Provider Notes (Signed)
History     CSN: [id number]  Arrival date & time 03/26/13  4098   First MD Initiated Contact with Patient 03/26/13 0630      Chief Complaint  Patient presents with  . Dental Pain    (Consider location/radiation/quality/duration/timing/severity/associated sxs/prior treatment) HPI Comments: "Abscessed tooth".  History of frequent visits to the ED for same.    Patient is a 32 y.o. male presenting with tooth pain. The history is provided by the patient.  Dental PainThe primary symptoms include mouth pain. The symptoms began 2 days ago. The symptoms are worsening. The symptoms are recurrent. The symptoms occur constantly.  Additional symptoms include: gum swelling and gum tenderness.    History reviewed. No pertinent past medical history.  History reviewed. No pertinent past surgical history.  History reviewed. No pertinent family history.  History  Substance Use Topics  . Smoking status: Current Every Day Smoker -- 0.50 packs/day for 1.5 years    Types: Cigarettes    Last Attempt to Quit: 11/25/2010  . Smokeless tobacco: Never Used  . Alcohol Use: No      Review of Systems  All other systems reviewed and are negative.    Allergies  Review of patient's allergies indicates no known allergies.  Home Medications   Current Outpatient Rx  Name  Route  Sig  Dispense  Refill  . amoxicillin (AMOXIL) 500 MG capsule   Oral   Take 1 capsule (500 mg total) by mouth 3 (three) times daily.   21 capsule   0   . HYDROcodone-acetaminophen (NORCO/VICODIN) 5-325 MG per tablet   Oral   Take 1 tablet by mouth every 4 (four) hours as needed for pain.   15 tablet   0   . oxyCODONE-acetaminophen (PERCOCET/ROXICET) 5-325 MG per tablet   Oral   Take 1 tablet by mouth every 6 (six) hours as needed for pain.   30 tablet   0   . sulfamethoxazole-trimethoprim (SEPTRA DS) 800-160 MG per tablet      1 po bid with food   14 tablet   0     BP 117/69  Temp(Src) 98.2 F (36.8 C)  (Oral)  Resp 18  Ht 5\' 6"  (1.676 m)  Wt 160 lb (72.576 kg)  BMI 25.84 kg/m2  SpO2 95%  Physical Exam  Nursing note and vitals reviewed. Constitutional: He is oriented to person, place, and time. He appears well-developed and well-nourished. No distress.  HENT:  Head: Normocephalic and atraumatic.  Mouth/Throat: Oropharynx is clear and moist.  There are multiple, heavily-decayed teeth throughout the mouth.  There is ttp, some swelling to the left lower mandible.  Neck: Normal range of motion. Neck supple.  Lymphadenopathy:    He has no cervical adenopathy.  Neurological: He is alert and oriented to person, place, and time.  Skin: Skin is warm and dry. He is not diaphoretic.    ED Course  Procedures (including critical care time)  Labs Reviewed - No data to display No results found.   No diagnosis found.    MDM  Antibiotics, pain meds.  Needs to see dentist.        Geoffery Lyons, MD 03/26/13 [phone]

## 2013-03-26 NOTE — ED Notes (Addendum)
Pt reporting tooth abscess on lower left.  Reports increased pain in past 2 days.  [facility].

## 2013-04-17 ENCOUNTER — Emergency Department (HOSPITAL_COMMUNITY)
Admission: EM | Admit: 2013-04-17 | Discharge: 2013-04-17 | Disposition: A | Discharge status: Home / Self Care | Payer: Self-pay | Attending: Emergency Medicine | Admitting: Emergency Medicine

## 2013-04-17 ENCOUNTER — Encounter (HOSPITAL_COMMUNITY): Payer: Self-pay | Admitting: *Deleted

## 2013-04-17 DIAGNOSIS — K089 Disorder of teeth and supporting structures, unspecified: Secondary | ICD-10-CM | POA: Insufficient documentation

## 2013-04-17 DIAGNOSIS — F17200 Nicotine dependence, unspecified, uncomplicated: Secondary | ICD-10-CM | POA: Insufficient documentation

## 2013-04-17 DIAGNOSIS — K029 Dental caries, unspecified: Secondary | ICD-10-CM | POA: Insufficient documentation

## 2013-04-17 MED ORDER — NAPROXEN 250 MG PO TABS: 250.0000 mg | ORAL_TABLET | Freq: Two times a day (BID) | ORAL | Status: DC

## 2013-04-17 MED ORDER — HYDROCODONE-ACETAMINOPHEN 5-325 MG PO TABS: ORAL_TABLET | ORAL | Status: DC

## 2013-04-17 MED ORDER — AMOXICILLIN 500 MG PO CAPS: 500.0000 mg | ORAL_CAPSULE | Freq: Three times a day (TID) | ORAL | Status: DC

## 2013-04-17 NOTE — ED Provider Notes (Signed)
History     CSN: [id number]  Arrival date & time 04/17/13  6578   First MD Initiated Contact with Patient 04/17/13 [phone]      Chief Complaint  Patient presents with  . Dental Pain     HPI Pt was seen at 0710.  Per pt, c/o gradual onset and persistence of constant left lower teeth "pain" for the past several months, worse over the past 3 days. States he is planning on going to the "free dental clinic" on 04/24/13.  Denies fevers, no intra-oral edema, no rash, no facial swelling, no dysphagia, no neck pain.   The condition is aggravated by nothing. The condition is relieved by nothing. The symptoms have been associated with no other complaints. The patient has no significant history of serious medical conditions. The symptoms have been associated with no other complaints. The patient has a significant history of similar symptoms previously, recently being evaluated for this complaint and multiple prior evals for same.       History reviewed. No pertinent past medical history.  History reviewed. No pertinent past surgical history.    History  Substance Use Topics  . Smoking status: Current Every Day Smoker -- 0.50 packs/day for 1.5 years    Types: Cigarettes    Last Attempt to Quit: 11/25/2010  . Smokeless tobacco: Never Used  . Alcohol Use: No      Review of Systems Physical examination: Vital signs and O2 SAT: Reviewed; Constitutional: Well developed, Well nourished, Well hydrated, In no acute distress; Head and Face: Normocephalic, Atraumatic; Eyes: EOMI, PERRL, No scleral icterus; ENMT: Mouth and pharynx normal, Poor dentition, Widespread dental decay, Left TM normal, Right TM normal, Mucous membranes moist, +upper left 2nd molar with dental decay.  No gingival erythema, edema, fluctuance, or drainage.  No intra-oral edema. No submandibular or sublingual edema. No hoarse voice, no drooling, no stridor.  ; Neck: Supple, Full range of motion, No lymphadenopathy; Cardiovascular:  Regular rate and rhythm, No murmur, rub, or gallop; Respiratory: Breath sounds clear & equal bilaterally, No rales, rhonchi, wheezes, Normal respiratory effort/excursion; Chest: Nontender, Movement normal; Extremities: Pulses normal, No tenderness, No edema; Neuro: AA&Ox3, Major CN grossly intact.  No gross focal motor or sensory deficits in extremities.; Skin: Color normal, No rash, No petechiae, Warm, Dry      Allergies  Review of patient's allergies indicates no known allergies.  Home Medications   Current Outpatient Rx  Name  Route  Sig  Dispense  Refill  . amoxicillin (AMOXIL) 500 MG capsule   Oral   Take 1 capsule (500 mg total) by mouth 3 (three) times daily.   21 capsule   0   . amoxicillin (AMOXIL) 500 MG capsule   Oral   Take 1 capsule (500 mg total) by mouth 3 (three) times daily.   30 capsule   0   . amoxicillin (AMOXIL) 500 MG capsule   Oral   Take 1 capsule (500 mg total) by mouth 3 (three) times daily.   21 capsule   0   . HYDROcodone-acetaminophen (NORCO/VICODIN) 5-325 MG per tablet   Oral   Take 1 tablet by mouth every 4 (four) hours as needed for pain.   15 tablet   0   . HYDROcodone-acetaminophen (NORCO/VICODIN) 5-325 MG per tablet      1 or 2 tabs PO q6 hours prn pain   15 tablet   0   . naproxen (NAPROSYN) 250 MG tablet   Oral   Take  1 tablet (250 mg total) by mouth 2 (two) times daily with a meal.   14 tablet   0   . oxyCODONE-acetaminophen (PERCOCET) 5-325 MG per tablet   Oral   Take 2 tablets by mouth every 4 (four) hours as needed for pain.   20 tablet   0   . oxyCODONE-acetaminophen (PERCOCET/ROXICET) 5-325 MG per tablet   Oral   Take 1 tablet by mouth every 6 (six) hours as needed for pain.   30 tablet   0   . sulfamethoxazole-trimethoprim (SEPTRA DS) 800-160 MG per tablet      1 po bid with food   14 tablet   0     BP 131/79  Pulse 71  Temp(Src) 97 F (36.1 C)  Resp 18  SpO2 100%  Physical Exam 0715: Physical  examination: Vital signs and O2 SAT: Reviewed; Constitutional: Well developed, Well nourished, Well hydrated, In no acute distress; Head and Face: Normocephalic, Atraumatic; Eyes: EOMI, PERRL, No scleral icterus; ENMT: Mouth and pharynx normal, Poor dentition, Widespread dental decay, Left TM normal, Right TM normal, Mucous membranes moist, +lower left molars with extensive dental decay.  No gingival erythema, edema, fluctuance, or drainage.  No intra-oral edema. No submandibular or sublingual edema. No hoarse voice, no drooling, no stridor.  ; Neck: Supple, Full range of motion, No lymphadenopathy; Cardiovascular: Regular rate and rhythm, No murmur, rub, or gallop; Respiratory: Breath sounds clear & equal bilaterally, No rales, rhonchi, wheezes, Normal respiratory effort/excursion; Chest: Nontender, Movement normal; Extremities: Pulses normal, No tenderness, No edema; Neuro: AA&Ox3, Major CN grossly intact.  No gross focal motor or sensory deficits in extremities.; Skin: Color normal, No rash, No petechiae, Warm, Dry   ED Course  Procedures   MDM  MDM Reviewed: previous chart, nursing note and vitals   0730:  Pt encouraged to f/u with dentist or oral surgeon for his dental needs for good continuity of care and definitive treatment.  Verb understanding.          Laray Anger, DO 04/20/13 1252

## 2013-04-17 NOTE — ED Notes (Signed)
Pt presents to er for left lower dental pain that started three days ago, has been seen in er on Mar 26, 2013 for same, states that pain became worse, is waiting to be seen at local dental clinic on Apr 24, 2013

## 2013-05-27 ENCOUNTER — Emergency Department (HOSPITAL_COMMUNITY)
Admission: EM | Admit: 2013-05-27 | Discharge: 2013-05-27 | Disposition: A | Discharge status: Home / Self Care | Payer: Self-pay | Attending: Emergency Medicine | Admitting: Emergency Medicine

## 2013-05-27 ENCOUNTER — Encounter (HOSPITAL_COMMUNITY): Payer: Self-pay

## 2013-05-27 DIAGNOSIS — G8929 Other chronic pain: Secondary | ICD-10-CM | POA: Insufficient documentation

## 2013-05-27 DIAGNOSIS — F17200 Nicotine dependence, unspecified, uncomplicated: Secondary | ICD-10-CM | POA: Insufficient documentation

## 2013-05-27 DIAGNOSIS — Z792 Long term (current) use of antibiotics: Secondary | ICD-10-CM | POA: Insufficient documentation

## 2013-05-27 DIAGNOSIS — Z79899 Other long term (current) drug therapy: Secondary | ICD-10-CM | POA: Insufficient documentation

## 2013-05-27 MED ORDER — ACETAMINOPHEN 325 MG PO TABS
650.0000 mg | ORAL_TABLET | Freq: Once | ORAL | Status: AC
  Administered 2013-05-27: 650 mg via ORAL
  Filled 2013-05-27: qty 2

## 2013-05-27 NOTE — ED Notes (Signed)
I have an abscessed tooth on the bottom right per pt

## 2013-05-27 NOTE — ED Provider Notes (Signed)
History    CSN: [id number] Arrival date & time 05/27/13  6962  First MD Initiated Contact with Patient 05/27/13 0601     Chief Complaint  Patient presents with  . Dental Pain    Patient is a 32 y.o. male presenting with tooth pain. The history is provided by the patient.  Dental Pain Location:  Lower Severity:  Moderate Onset quality:  Gradual Timing:  Constant Progression:  Worsening Chronicity:  Chronic Worsened by:  Jaw movement Associated symptoms: no fever      PMH - none   History  Substance Use Topics  . Smoking status: Current Every Day Smoker -- 0.50 packs/day for 1.5 years    Types: Cigarettes    Last Attempt to Quit: 11/25/2010  . Smokeless tobacco: Never Used  . Alcohol Use: No    Review of Systems  Constitutional: Negative for fever.  Gastrointestinal: Negative for vomiting.    Allergies  Review of patient's allergies indicates no known allergies.  Home Medications   Current Outpatient Rx  Name  Route  Sig  Dispense  Refill  . amoxicillin (AMOXIL) 500 MG capsule   Oral   Take 1 capsule (500 mg total) by mouth 3 (three) times daily.   21 capsule   0   . amoxicillin (AMOXIL) 500 MG capsule   Oral   Take 1 capsule (500 mg total) by mouth 3 (three) times daily.   30 capsule   0   . amoxicillin (AMOXIL) 500 MG capsule   Oral   Take 1 capsule (500 mg total) by mouth 3 (three) times daily.   21 capsule   0   . HYDROcodone-acetaminophen (NORCO/VICODIN) 5-325 MG per tablet   Oral   Take 1 tablet by mouth every 4 (four) hours as needed for pain.   15 tablet   0   . HYDROcodone-acetaminophen (NORCO/VICODIN) 5-325 MG per tablet      1 or 2 tabs PO q6 hours prn pain   15 tablet   0   . naproxen (NAPROSYN) 250 MG tablet   Oral   Take 1 tablet (250 mg total) by mouth 2 (two) times daily with a meal.   14 tablet   0   . oxyCODONE-acetaminophen (PERCOCET) 5-325 MG per tablet   Oral   Take 2 tablets by mouth every 4 (four) hours as  needed for pain.   20 tablet   0   . oxyCODONE-acetaminophen (PERCOCET/ROXICET) 5-325 MG per tablet   Oral   Take 1 tablet by mouth every 6 (six) hours as needed for pain.   30 tablet   0   . sulfamethoxazole-trimethoprim (SEPTRA DS) 800-160 MG per tablet      1 po bid with food   14 tablet   0    BP 138/83  Pulse 88  Temp(Src) 98.4 F (36.9 C) (Oral)  Resp 20  SpO2 100% Physical Exam CONSTITUTIONAL: Well developed/well nourished HEAD AND FACE: Normocephalic/atraumatic EYES: EOMI ENMT: Mucous membranes moist.  Poor dentition.  No trismus.  No focal abscess noted. NECK: supple no meningeal signs CV: S1/S2 noted, no murmurs/rubs/gallops noted LUNGS: Lungs are clear to auscultation bilaterally, no apparent distress ABDOMEN: soft, nontender, no rebound or guarding NEURO: Pt is awake/alert, moves all extremitiesx4 EXTREMITIES:full ROM SKIN: warm, color normal  ED Course  Procedures (including critical care time) Labs Reviewed - No data to display No results found. 1. Chronic pain     MDM  Nursing notes including past medical  history and social history reviewed and considered in documentation   Pt with multiple ED visits for dental pain.  He reports he is supposed to have oral surgery later this month.  I advised need for outpatient followup for his chronic pain   Joya Gaskins, MD 05/27/13 [phone]

## 2013-08-15 ENCOUNTER — Emergency Department (HOSPITAL_COMMUNITY)
Admission: EM | Admit: 2013-08-15 | Discharge: 2013-08-15 | Disposition: A | Discharge status: Home / Self Care | Payer: Self-pay | Attending: Emergency Medicine | Admitting: Emergency Medicine

## 2013-08-15 ENCOUNTER — Encounter (HOSPITAL_COMMUNITY): Payer: Self-pay | Admitting: *Deleted

## 2013-08-15 DIAGNOSIS — F17200 Nicotine dependence, unspecified, uncomplicated: Secondary | ICD-10-CM | POA: Insufficient documentation

## 2013-08-15 DIAGNOSIS — K029 Dental caries, unspecified: Secondary | ICD-10-CM | POA: Insufficient documentation

## 2013-08-15 DIAGNOSIS — K0381 Cracked tooth: Secondary | ICD-10-CM | POA: Insufficient documentation

## 2013-08-15 MED ORDER — CLINDAMYCIN HCL 150 MG PO CAPS
300.0000 mg | ORAL_CAPSULE | Freq: Once | ORAL | Status: AC
Start: 2013-08-15 — End: 2013-08-15
  Administered 2013-08-15: 300 mg via ORAL
  Filled 2013-08-15: qty 2

## 2013-08-15 MED ORDER — DICLOFENAC SODIUM 75 MG PO TBEC: 75.0000 mg | DELAYED_RELEASE_TABLET | Freq: Two times a day (BID) | ORAL | Status: DC

## 2013-08-15 MED ORDER — TRAMADOL HCL 50 MG PO TABS: 50.0000 mg | ORAL_TABLET | Freq: Four times a day (QID) | ORAL | Status: DC | PRN

## 2013-08-15 MED ORDER — AMOXICILLIN 500 MG PO CAPS: 500.0000 mg | ORAL_CAPSULE | Freq: Three times a day (TID) | ORAL | Status: DC

## 2013-08-15 MED ORDER — TRAMADOL HCL 50 MG PO TABS
100.0000 mg | ORAL_TABLET | Freq: Once | ORAL | Status: AC
  Administered 2013-08-15: 100 mg via ORAL
  Filled 2013-08-15: qty 2

## 2013-08-15 MED ORDER — ONDANSETRON HCL 4 MG PO TABS
4.0000 mg | ORAL_TABLET | Freq: Once | ORAL | Status: AC
  Administered 2013-08-15: 4 mg via ORAL
  Filled 2013-08-15: qty 1

## 2013-08-15 MED ORDER — KETOROLAC TROMETHAMINE 10 MG PO TABS
10.0000 mg | ORAL_TABLET | Freq: Once | ORAL | Status: AC
  Administered 2013-08-15: 10 mg via ORAL
  Filled 2013-08-15: qty 2

## 2013-08-15 NOTE — ED Notes (Signed)
Pt alert & oriented x4, stable gait. Patient given discharge instructions, paperwork & prescription(s). Patient  instructed to stop at the registration desk to finish any additional paperwork. Patient verbalized understanding. Pt left department w/ no further questions. 

## 2013-08-15 NOTE — ED Provider Notes (Signed)
CSN: [id number]     Arrival date & time 08/15/13  6440 History   First MD Initiated Contact with Patient 08/15/13 1001     Chief Complaint  Patient presents with  . Dental Pain   (Consider location/radiation/quality/duration/timing/severity/associated sxs/prior Treatment) Patient is a 32 y.o. male presenting with tooth pain. The history is provided by the patient.  Dental Pain Location:  Upper Quality:  Throbbing Severity:  Moderate Onset quality:  Gradual Duration:  2 days Timing:  Constant Progression:  Worsening Chronicity:  Chronic Context: dental caries, dental fracture and poor dentition   Context comment:  Dental fracture 1 year ago Relieved by:  Nothing Ineffective treatments:  NSAIDs Associated symptoms: gum swelling and headaches   Risk factors: smoking   Risk factors: no diabetes     History reviewed. No pertinent past medical history. History reviewed. No pertinent past surgical history. History reviewed. No pertinent family history. History  Substance Use Topics  . Smoking status: Current Every Day Smoker -- 0.50 packs/day for 1.5 years    Types: Cigarettes    Last Attempt to Quit: 11/25/2010  . Smokeless tobacco: Never Used  . Alcohol Use: No    Review of Systems  Neurological: Positive for headaches.    Allergies  Review of patient's allergies indicates no known allergies.  Home Medications   Current Outpatient Rx  Name  Route  Sig  Dispense  Refill  . ibuprofen (ADVIL,MOTRIN) 200 MG tablet   Oral   Take 800 mg by mouth every 6 (six) hours as needed for pain.          BP 156/69  Pulse 87  Temp(Src) 98 F (36.7 C) (Oral)  Resp 20  Ht 5\' 6"  (1.676 m)  Wt 152 lb (68.947 kg)  BMI 24.55 kg/m2  SpO2 100% Physical Exam  Nursing note and vitals reviewed. Constitutional: He is oriented to person, place, and time. He appears well-developed and well-nourished.  Non-toxic appearance.  HENT:  Head: Normocephalic.  Right Ear: Tympanic membrane  and external ear normal.  Left Ear: Tympanic membrane and external ear normal.  Multiple broken an infected teeth down to the gum line of the lower jaw. There is no visible abscess. There is no drooling appreciated. There is no swelling under the tongue. The airway is patent. There is significant swelling of the gum of the upper and lower jaw.  Eyes: EOM and lids are normal. Pupils are equal, round, and reactive to light.  Neck: Normal range of motion. Neck supple. Carotid bruit is not present.  Cardiovascular: Normal rate, regular rhythm, normal heart sounds, intact distal pulses and normal pulses.   Pulmonary/Chest: Breath sounds normal. No respiratory distress.  Abdominal: Soft. Bowel sounds are normal. There is no tenderness. There is no guarding.  Musculoskeletal: Normal range of motion.  Lymphadenopathy:       Head (right side): No submandibular adenopathy present.       Head (left side): No submandibular adenopathy present.    He has no cervical adenopathy.  Neurological: He is alert and oriented to person, place, and time. He has normal strength. No cranial nerve deficit or sensory deficit.  Skin: Skin is warm and dry.  Psychiatric: He has a normal mood and affect. His speech is normal.    ED Course  Procedures (including critical care time) Labs Review Labs Reviewed - No data to display Imaging Review No results found.  MDM  No diagnosis found. *I have reviewed nursing notes, vital signs, and all  appropriate lab and imaging results for this patient.**  Patient has multiple broken teeth and dental caries down to the gum line of the upper and lower jaw. Lower jaw worse than upper jaw. No visible abscess appreciated. No evidence for Ludwig's angina. Patient strongly advised to see the dental surgeon as scheduled for next month. Prescription for amoxicillin, back of the neck, and tramadol given to the patient.  Kathie Dike, PA-C 08/15/13 1114

## 2013-08-15 NOTE — ED Provider Notes (Signed)
Medical screening examination/treatment/procedure(s) were performed by non-physician practitioner and as supervising physician I was immediately available for consultation/collaboration.   Kandee Escalante J Zelia Yzaguirre, MD 08/15/13 1510 

## 2013-08-15 NOTE — ED Notes (Signed)
Pt co rt upper jaw pain, suspected abscess, pt has appointment 08/30/13 for same.

## 2013-11-10 ENCOUNTER — Emergency Department (HOSPITAL_COMMUNITY)
Admission: EM | Admit: 2013-11-10 | Discharge: 2013-11-10 | Disposition: A | Discharge status: Home / Self Care | Payer: Self-pay | Attending: Emergency Medicine | Admitting: Emergency Medicine

## 2013-11-10 ENCOUNTER — Encounter (HOSPITAL_COMMUNITY): Payer: Self-pay | Admitting: Emergency Medicine

## 2013-11-10 DIAGNOSIS — K029 Dental caries, unspecified: Secondary | ICD-10-CM | POA: Insufficient documentation

## 2013-11-10 DIAGNOSIS — F17200 Nicotine dependence, unspecified, uncomplicated: Secondary | ICD-10-CM | POA: Insufficient documentation

## 2013-11-10 DIAGNOSIS — G8929 Other chronic pain: Secondary | ICD-10-CM | POA: Insufficient documentation

## 2013-11-10 MED ORDER — KETOROLAC TROMETHAMINE 60 MG/2ML IM SOLN
60.0000 mg | Freq: Once | INTRAMUSCULAR | Status: DC
  Filled 2013-11-10: qty 2

## 2013-11-10 MED ORDER — IBUPROFEN 800 MG PO TABS: 800.0000 mg | ORAL_TABLET | Freq: Three times a day (TID) | ORAL | Status: DC

## 2013-11-10 MED ORDER — AMOXICILLIN 500 MG PO CAPS: 500.0000 mg | ORAL_CAPSULE | Freq: Three times a day (TID) | ORAL | Status: DC

## 2013-11-10 NOTE — ED Provider Notes (Signed)
CSN: [id number]     Arrival date & time 11/10/13  1252 History   First MD Initiated Contact with Patient 11/10/13 1300     Chief Complaint  Patient presents with  . Dental Pain   (Consider location/radiation/quality/duration/timing/severity/associated sxs/prior Treatment) HPI Johnathan Welch is a 32 y.o. male presents emergency department complaining of chronic dental pain. Patient states he has bad teeth. He states he saw Dr. [name] the oral surgeon one month ago at which time they were not able to pull his teeth out because he had an infection. He was told to reschedule his appointment. Patient states that Dr. [name] does not have an opening until January to see him. Patient states at this time the tooth started to hurt 4 days ago. It's been getting worse. Pain is in the right upper jaw. It radiates into the right face. He states he feels like his face is swollen. He has not taken any medications for this. He denies any swelling under the tongue. He denies any neck pain. He denies any swelling of his third difficulty swallowing. He denies any fever or chills. He states that cold air and chewing makes the pain worse nothing makes it better.   History reviewed. No pertinent past medical history. History reviewed. No pertinent past surgical history. History reviewed. No pertinent family history. History  Substance Use Topics  . Smoking status: Current Every Day Smoker -- 0.50 packs/day for 1.5 years    Types: Cigarettes    Last Attempt to Quit: 11/25/2010  . Smokeless tobacco: Never Used  . Alcohol Use: No    Review of Systems  Constitutional: Negative for fever and chills.  HENT: Positive for dental problem and facial swelling.   Musculoskeletal: Negative for neck pain.  Skin: Negative for rash.  Allergic/Immunologic: Negative for immunocompromised state.  Neurological: Negative for dizziness, weakness, light-headedness, numbness and headaches.    Allergies  Review of  patient's allergies indicates no known allergies.  Home Medications   Current Outpatient Rx  Name  Route  Sig  Dispense  Refill  . diclofenac (VOLTAREN) 75 MG EC tablet   Oral   Take 1 tablet (75 mg total) by mouth 2 (two) times daily.   12 tablet   0   . ibuprofen (ADVIL,MOTRIN) 200 MG tablet   Oral   Take 800 mg by mouth every 6 (six) hours as needed for pain.         . traMADol (ULTRAM) 50 MG tablet   Oral   Take 1 tablet (50 mg total) by mouth every 6 (six) hours as needed for pain.   15 tablet   0    BP 95/62  Pulse 95  Temp(Src) 98.2 F (36.8 C) (Oral)  Resp 16  Ht 5\' 6"  (1.676 m)  Wt 150 lb (68.04 kg)  BMI 24.22 kg/m2  SpO2 98% Physical Exam  Nursing note and vitals reviewed. Constitutional: He appears well-developed and well-nourished. No distress.  HENT:  Head: Normocephalic.  No facial swelling noted. External ears, ear canal, TMs are normal bilaterally. All of his teeth are eroded to the gumline. Multiple dental care he is an every tooth in his mouth. There is no gum swelling or erythema noted on my exam. He is tender to the right upper first molar. There is no trismus. There is no swelling under his.  Eyes: Conjunctivae are normal.  Neck: Neck supple.  Cardiovascular: Normal rate, regular rhythm and normal heart sounds.   Pulmonary/Chest: Effort normal and  breath sounds normal. No respiratory distress. He has no wheezes. He has no rales.  Lymphadenopathy:    He has no cervical adenopathy.  Neurological: He is alert.  Skin: Skin is warm and dry.    ED Course  Procedures (including critical care time) Labs Review Labs Reviewed - No data to display Imaging Review No results found.  EKG Interpretation   None       MDM   1. Dental decay     Patient with multiple dental caries and widespread mouth decay. No obvious abscess or facial swelling on exam at this time. This is a chronic problem for this patient. I will start him on antibiotics to  prevent any infection, given Toradol 60 mg IM in emergency department. He'll be going home with ibuprofen and close followup with a dentist.   Filed Vitals:   11/10/13 1301  BP: 95/62  Pulse: 95  Temp: 98.2 F (36.8 C)  Resp: 16     Chasidy Janak A Diann Bangerter, PA-C 11/10/13 1401

## 2013-11-10 NOTE — ED Notes (Signed)
Pt refused injection, EDPa informed.

## 2013-11-10 NOTE — ED Provider Notes (Signed)
Medical screening examination/treatment/procedure(s) were performed by non-physician practitioner and as supervising physician I was immediately available for consultation/collaboration.  EKG Interpretation   None         Joya Gaskins, MD 11/10/13 1423

## 2013-11-10 NOTE — ED Notes (Signed)
Pt c/o R upper jaw dental pain, onset 2 nights ago.

## 2014-02-04 ENCOUNTER — Encounter (HOSPITAL_COMMUNITY): Payer: Self-pay | Admitting: Emergency Medicine

## 2014-02-04 ENCOUNTER — Emergency Department (HOSPITAL_COMMUNITY)
Admission: EM | Admit: 2014-02-04 | Discharge: 2014-02-04 | Disposition: A | Discharge status: Home / Self Care | Payer: Self-pay | Attending: Emergency Medicine | Admitting: Emergency Medicine

## 2014-02-04 DIAGNOSIS — G8929 Other chronic pain: Secondary | ICD-10-CM | POA: Insufficient documentation

## 2014-02-04 DIAGNOSIS — F17200 Nicotine dependence, unspecified, uncomplicated: Secondary | ICD-10-CM | POA: Insufficient documentation

## 2014-02-04 DIAGNOSIS — K029 Dental caries, unspecified: Secondary | ICD-10-CM | POA: Insufficient documentation

## 2014-02-04 DIAGNOSIS — K089 Disorder of teeth and supporting structures, unspecified: Secondary | ICD-10-CM | POA: Insufficient documentation

## 2014-02-04 MED ORDER — PENICILLIN V POTASSIUM 500 MG PO TABS: 500.0000 mg | ORAL_TABLET | Freq: Four times a day (QID) | ORAL | Status: AC

## 2014-02-04 MED ORDER — IBUPROFEN 600 MG PO TABS: 600.0000 mg | ORAL_TABLET | Freq: Four times a day (QID) | ORAL | Status: DC | PRN
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
## 2014-02-04 NOTE — ED Notes (Signed)
Pt reports left lower dental pain x 1 week
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
## 2014-02-04 NOTE — Discharge Instructions (Signed)
Dental Caries  Dental caries (also called tooth decay) is the most common oral disease. It can occur at any age, but is more common in children and young adults.  HOW DENTAL CARIES DEVELOPS  The process of decay begins when bacteria and foods (particularly sugars and starches) combine in your mouth to produce plaque. Plaque is a substance that sticks to the hard, outer surface of a tooth (enamel). The bacteria in plaque produce acids that attack enamel. These acids may also attack the root surface of a tooth (cementum) if it is exposed. Repeated attacks dissolve these surfaces and create holes in the tooth (cavities). If left untreated, the acids destroy the other layers of the tooth.  RISK FACTORS  Frequent sipping of sugary beverages.   Frequent snacking on sugary and starchy foods, especially those that easily get stuck in the teeth.   Poor oral hygiene.   Dry mouth.   Substance abuse such as methamphetamine abuse.   Broken or poor-fitting dental restorations.   Eating disorders.   Gastroesophageal reflux disease (GERD).   Certain radiation treatments to the head and neck. SYMPTOMS In the early stages of dental caries, symptoms are seldom present. Sometimes white, chalky areas may be seen on the enamel or other tooth layers. In later stages, symptoms may include:  Pits and holes on the enamel.  Toothache after sweet, hot, or cold foods or drinks are consumed.  Pain around the tooth.  Swelling around the tooth. DIAGNOSIS  Most of the time, dental caries is detected during a regular dental checkup. A diagnosis is made after a thorough medical and dental history is taken and the surfaces of your teeth are checked for signs of dental caries. Sometimes special instruments, such as lasers, are used to check for dental caries. Dental X-ray exams may be taken so that areas not visible to the eye (such as between the contact areas of the teeth) can be checked for cavities.    TREATMENT  If dental caries is in its early stages, it may be reversed with a fluoride treatment or an application of a remineralizing agent at the dental office. Thorough brushing and flossing at home is needed to aid these treatments. If it is in its later stages, treatment depends on the location and extent of tooth destruction:   If a small area of the tooth has been destroyed, the destroyed area will be removed and cavities will be filled with a material such as gold, silver amalgam, or composite resin.   If a large area of the tooth has been destroyed, the destroyed area will be removed and a cap (crown) will be fitted over the remaining tooth structure.   If the center part of the tooth (pulp) is affected, a procedure called a root canal will be needed before a filling or crown can be placed.   If most of the tooth has been destroyed, the tooth may need to be pulled (extracted). HOME CARE INSTRUCTIONS You can prevent, stop, or reverse dental caries at home by practicing good oral hygiene. Good oral hygiene includes:  Thoroughly cleaning your teeth at least twice a day with a toothbrush and dental floss.   Using a fluoride toothpaste. A fluoride mouth rinse may also be used if recommended by your dentist or health care provider.   Restricting the amount of sugary and starchy foods and sugary liquids you consume.   Avoiding frequent snacking on these foods and sipping of these liquids.   Keeping regular visits  with a dentist for checkups and cleanings. PREVENTION   Practice good oral hygiene.  Consider a dental sealant. A dental sealant is a coating material that is applied by your dentist to the pits and grooves of teeth. The sealant prevents food from being trapped in them. It may protect the teeth for several years.  Ask about fluoride supplements if you live in a community without fluorinated water or with water that has a low fluoride content. Use fluoride supplements  as directed by your dentist or health care provider.  Allow fluoride varnish applications to teeth if directed by your dentist or health care provider. Document Released: 08/03/2002 Document Revised: 07/14/2013 Document Reviewed: 11/13/2012 [facility] Patient Information 2014 [employer] [place].  Chronic Pain Discharge Instructions  Emergency care providers appreciate that many patients coming to us are in severe pain and we wish to address their pain in the safest, most responsible manner.  It is important to recognize however, that the proper treatment of chronic pain differs from that of the pain of injuries and acute illnesses.  Our goal is to provide quality, safe, personalized care and we thank you for giving us the opportunity to serve you. The use of narcotics and related agents for chronic pain syndromes may lead to additional physical and psychological problems.  Nearly as many people die from prescription narcotics each year as die from car crashes.  Additionally, this risk is increased if such prescriptions are obtained from a variety of sources.  Therefore, only your primary care physician or a pain management specialist is able to safely treat such syndromes with narcotic medications long-term.    Documentation revealing such prescriptions have been sought from multiple sources may prohibit us from providing a refill or different narcotic medication.  Your name may be checked first through the [facility] Controlled Substances Reporting System.  This database is a record of controlled substance medication prescriptions that the patient has received.  This has been established by [facility]  in an effort to eliminate the dangerous, and often life threatening, practice of obtaining multiple prescriptions from different medical providers.   If you have a chronic pain syndrome (i.e. chronic headaches, recurrent back or neck pain, dental pain, abdominal or pelvis pain without a specific  diagnosis, or neuropathic pain such as fibromyalgia) or recurrent visits for the same condition without an acute diagnosis, you may be treated with non-narcotics and other non-addictive medicines.  Allergic reactions or negative side effects that may be reported by a patient to such medications will not typically lead to the use of a narcotic analgesic or other controlled substance as an alternative.   Patients managing chronic pain with a personal physician should have provisions in place for breakthrough pain.  If you are in crisis, you should call your physician.  If your physician directs you to the emergency department, please have the doctor call and speak to our attending physician concerning your care.   When patients come to the Emergency Department (ED) with acute medical conditions in which the Emergency Department physician feels appropriate to prescribe narcotic or sedating pain medication, the physician will prescribe these in very limited quantities.  The amount of these medications will last only until you can see your primary care physician in his/her office.  Any patient who returns to the ED seeking refills should expect only non-narcotic pain medications.   In the event of an acute medical condition exists and the emergency physician feels it is necessary that the patient be  given a narcotic or sedating medication -  a responsible adult driver should be present in the room prior to the medication being given by the nurse.   Prescriptions for narcotic or sedating medications that have been lost, stolen or expired will not be refilled in the Emergency Department.    Patients who have chronic pain may receive non-narcotic prescriptions until seen by their primary care physician.  It is every patients personal responsibility to maintain active prescriptions with his or her primary care physician or specialist.

## 2014-02-04 NOTE — ED Notes (Signed)
Pt alert & oriented x4, stable gait. Patient given discharge instructions, paperwork & prescription(s). Patient  instructed to stop at the registration desk to finish any additional paperwork. Patient verbalized understanding. Pt left department w/ no further questions. 
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
## 2014-03-27 ENCOUNTER — Encounter (HOSPITAL_COMMUNITY): Payer: Self-pay | Admitting: Emergency Medicine

## 2014-03-27 ENCOUNTER — Emergency Department (HOSPITAL_COMMUNITY)
Admission: EM | Admit: 2014-03-27 | Discharge: 2014-03-27 | Disposition: A | Discharge status: Home / Self Care | Payer: Self-pay | Attending: Emergency Medicine | Admitting: Emergency Medicine

## 2014-03-27 DIAGNOSIS — K029 Dental caries, unspecified: Secondary | ICD-10-CM | POA: Insufficient documentation

## 2014-03-27 DIAGNOSIS — Z791 Long term (current) use of non-steroidal anti-inflammatories (NSAID): Secondary | ICD-10-CM | POA: Insufficient documentation

## 2014-03-27 DIAGNOSIS — F17200 Nicotine dependence, unspecified, uncomplicated: Secondary | ICD-10-CM | POA: Insufficient documentation

## 2014-03-27 DIAGNOSIS — K044 Acute apical periodontitis of pulpal origin: Secondary | ICD-10-CM | POA: Insufficient documentation

## 2014-03-27 DIAGNOSIS — Z792 Long term (current) use of antibiotics: Secondary | ICD-10-CM | POA: Insufficient documentation

## 2014-03-27 DIAGNOSIS — K047 Periapical abscess without sinus: Secondary | ICD-10-CM

## 2014-03-27 MED ORDER — TRAMADOL HCL 50 MG PO TABS
100.0000 mg | ORAL_TABLET | Freq: Once | ORAL | Status: AC
  Administered 2014-03-27: 100 mg via ORAL
  Filled 2014-03-27: qty 2

## 2014-03-27 MED ORDER — IBUPROFEN 800 MG PO TABS
800.0000 mg | ORAL_TABLET | Freq: Once | ORAL | Status: AC
  Administered 2014-03-27: 800 mg via ORAL
  Filled 2014-03-27: qty 1

## 2014-03-27 MED ORDER — PENICILLIN V POTASSIUM 250 MG PO TABS
500.0000 mg | ORAL_TABLET | Freq: Once | ORAL | Status: AC
  Administered 2014-03-27: 500 mg via ORAL
  Filled 2014-03-27: qty 2

## 2014-03-27 MED ORDER — AMOXICILLIN 500 MG PO CAPS: 500.0000 mg | ORAL_CAPSULE | Freq: Three times a day (TID) | ORAL | Status: DC

## 2014-03-27 MED ORDER — CEFTRIAXONE SODIUM 1 G IJ SOLR
1.0000 g | Freq: Once | INTRAMUSCULAR | Status: DC
  Filled 2014-03-27: qty 10

## 2014-03-27 MED ORDER — TRAMADOL HCL 50 MG PO TABS
ORAL_TABLET | ORAL | Status: DC
Start: 2014-03-27 — End: 2014-09-27

## 2014-03-27 MED ORDER — IBUPROFEN 800 MG PO TABS: 800.0000 mg | ORAL_TABLET | Freq: Three times a day (TID) | ORAL | Status: DC

## 2014-03-27 MED ORDER — ONDANSETRON HCL 4 MG PO TABS
4.0000 mg | ORAL_TABLET | Freq: Once | ORAL | Status: AC
  Administered 2014-03-27: 4 mg via ORAL
  Filled 2014-03-27: qty 1

## 2014-03-27 NOTE — Discharge Instructions (Signed)
Is extremely important that you see a dentist as sone as possible concerning the multiple cavities and infection in your mouth. an antibiotic called Rocephin was offered to you today to help with this infection, but declined by you. Please use the Amoxil 3 times daily, please use ibuprofen 3 times daily with food. May use Ultram every 6 hours if needed for pain, this medication may cause drowsiness, please use with caution.

## 2014-03-27 NOTE — ED Notes (Signed)
C/o L. Lower dental pain X 3 days. L. Jaw area slightly swollen.

## 2014-03-27 NOTE — ED Provider Notes (Signed)
CSN: [id number]     Arrival date & time 03/27/14  1144 History  This chart was scribed for Ivery QualeHobson Hinley Brimage, PA, working with Juliet RudeNathan R. Rubin PayorPickering, MD by Ardelia Memsylan Malpass, ED Scribe. This patient was seen in room APFT24/APFT24 and the patient's care was started at 12:53 PM.  Chief Complaint  Patient presents with  . Dental Pain    Patient is a 33 y.o. male presenting with tooth pain. The history is provided by the patient. No language interpreter was used.  Dental Pain Location:  Lower Lower teeth location: left sided. Quality:  Unable to specify Severity:  Moderate Onset quality:  Gradual Duration:  3 days Timing:  Constant Progression:  Worsening Chronicity:  New Context: abscess (suspected)   Relieved by:  None tried Worsened by:  Nothing tried Ineffective treatments:  None tried Associated symptoms: facial swelling   Associated symptoms: no difficulty swallowing and no fever   Risk factors: smoking     HPI Comments: Johnathan Welch is a 33 y.o. Male with no chronic medical conditions who presents to the Emergency Department complaining of constant, moderate left lower dental pain over the past 3 days. He reports associated facial swelling. He believes that he has an abscess to the area. He states that he has an appointment with his Dentist (Dr. [name]) later this month. He denies fever or any other symptoms. He is a current every day smoker. He states that he has no known antibiotic allergies and that he has done well with Amoxicillin in the past.  PCP- None   History reviewed. No pertinent past medical history. History reviewed. No pertinent past surgical history. History reviewed. No pertinent family history. History  Substance Use Topics  . Smoking status: Current Every Day Smoker -- 0.50 packs/day for 1.5 years    Types: Cigarettes    Last Attempt to Quit: 11/25/2010  . Smokeless tobacco: Never Used  . Alcohol Use: No    Review of Systems  Constitutional: Negative  for fever.  HENT: Positive for dental problem and facial swelling.   All other systems reviewed and are negative.   Allergies  Review of patient's allergies indicates no known allergies.  Home Medications   Prior to Admission medications   Medication Sig Start Date End Date Taking? Authorizing Provider  amoxicillin (AMOXIL) 500 MG capsule Take 1 capsule (500 mg total) by mouth 3 (three) times daily. 11/10/13   Tatyana A Kirichenko, PA-C  ibuprofen (ADVIL,MOTRIN) 600 MG tablet Take 1 tablet (600 mg total) by mouth every 6 (six) hours as needed. 02/04/14   Shon Batonourtney F Horton, MD  ibuprofen (ADVIL,MOTRIN) 800 MG tablet Take 1 tablet (800 mg total) by mouth 3 (three) times daily. 11/10/13   Tatyana A Kirichenko, PA-C  oxyCODONE-acetaminophen (PERCOCET/ROXICET) 5-325 MG per tablet Take by mouth every 4 (four) hours as needed for severe pain.    Historical Provider, MD   Triage Vitals: BP 140/76  Pulse 78  Temp(Src) 98.3 F (36.8 C) (Oral)  Ht 5\' 6"  (1.676 m)  Wt 160 lb (72.576 kg)  BMI 25.84 kg/m2  SpO2 99%  Physical Exam  Nursing note and vitals reviewed. Constitutional: He is oriented to person, place, and time. He appears well-developed and well-nourished. No distress.  HENT:  Head: Normocephalic and atraumatic.  Multiple dental caries decayed to the gumline. Swelling of the lower and upper gum. No swelling under the tongue.  Eyes: EOM are normal.  Neck: Neck supple. No tracheal deviation present.  Tenderness over the anterior  lymph nodes on the left  Cardiovascular: Normal rate and regular rhythm.  Exam reveals no friction rub.   No murmur heard. Pulmonary/Chest: Effort normal. No respiratory distress.  Musculoskeletal: Normal range of motion.  Neurological: He is alert and oriented to person, place, and time.  Skin: Skin is warm and dry.  Psychiatric: He has a normal mood and affect. His behavior is normal.    ED Course  Procedures (including critical care  time)  DIAGNOSTIC STUDIES: Oxygen Saturation is 99% on RA, normal by my interpretation.    COORDINATION OF CARE: 12:57 PM- Discussed that pt has a dental infection. Advised pt on options for follow-up. Will treat pt with Rocephin, Ultram and Ibuprofen in the ED. Will discharge with prescriptions for Amoxicillin, Ibuprofen and Ultram. Pt advised of plan for treatment and pt agrees.  Medications  cefTRIAXone (ROCEPHIN) injection 1 g (not administered)  ibuprofen (ADVIL,MOTRIN) tablet 800 mg (not administered)  traMADol (ULTRAM) tablet 100 mg (not administered)  ondansetron (ZOFRAN) tablet 4 mg (not administered)    Labs Review Labs Reviewed - No data to display  Imaging Review No results found.   EKG Interpretation None      MDM Pt has extensive dental caries and disease. Rocephin injection offered, but pt refused. Rx for amoxil, ibuprofen 800, and ultram given to the patient. Pt given strict instructions to see his dentist or oral surgeon as soon as possible.   Final diagnoses:  None    **I have reviewed nursing notes, vital signs, and all appropriate lab and imaging results for this patient.*   **I personally performed the services described in this documentation, which was scribed in my presence. The recorded information has been reviewed and is accurate.Kathie Dike*  Elienai Gailey M Linah Klapper, PA-C 03/27/14 2202

## 2014-03-29 NOTE — ED Provider Notes (Signed)
Medical screening examination/treatment/procedure(s) were performed by non-physician practitioner and as supervising physician I was immediately available for consultation/collaboration.   EKG Interpretation None       Susy Placzek R. Geneive Sandstrom, MD 03/29/14 1209 

## 2014-06-13 ENCOUNTER — Encounter (HOSPITAL_COMMUNITY): Payer: Self-pay | Admitting: Emergency Medicine

## 2014-06-13 ENCOUNTER — Emergency Department (HOSPITAL_COMMUNITY)
Admission: EM | Admit: 2014-06-13 | Discharge: 2014-06-13 | Disposition: A | Discharge status: Home / Self Care | Payer: Self-pay | Attending: Emergency Medicine | Admitting: Emergency Medicine

## 2014-06-13 DIAGNOSIS — F17200 Nicotine dependence, unspecified, uncomplicated: Secondary | ICD-10-CM | POA: Insufficient documentation

## 2014-06-13 DIAGNOSIS — Z79899 Other long term (current) drug therapy: Secondary | ICD-10-CM | POA: Insufficient documentation

## 2014-06-13 DIAGNOSIS — K089 Disorder of teeth and supporting structures, unspecified: Secondary | ICD-10-CM | POA: Insufficient documentation

## 2014-06-13 DIAGNOSIS — K029 Dental caries, unspecified: Secondary | ICD-10-CM | POA: Insufficient documentation

## 2014-06-13 DIAGNOSIS — K0889 Other specified disorders of teeth and supporting structures: Secondary | ICD-10-CM

## 2014-06-13 MED ORDER — AMOXICILLIN 500 MG PO CAPS: 500.0000 mg | ORAL_CAPSULE | Freq: Three times a day (TID) | ORAL | Status: DC

## 2014-06-13 MED ORDER — HYDROCODONE-ACETAMINOPHEN 5-325 MG PO TABS: ORAL_TABLET | ORAL | Status: DC

## 2014-06-13 NOTE — ED Notes (Signed)
PT c/o right upper dental pain x2 days.  

## 2014-06-13 NOTE — Discharge Instructions (Signed)
Dental Pain °Toothache is pain in or around a tooth. It may get worse with chewing or with cold or heat.  °HOME CARE °· Your dentist may use a numbing medicine during treatment. If so, you may need to avoid eating until the medicine wears off. Ask your dentist about this. °· Only take medicine as told by your dentist or doctor. °· Avoid chewing food near the painful tooth until after all treatment is done. Ask your dentist about this. °GET HELP RIGHT AWAY IF:  °· The problem gets worse or new problems appear. °· You have a fever. °· There is redness and puffiness (swelling) of the face, jaw, or neck. °· You cannot open your mouth. °· There is pain in the jaw. °· There is very bad pain that is not helped by medicine. °MAKE SURE YOU:  °· Understand these instructions. °· Will watch your condition. °· Will get help right away if you are not doing well or get worse. °Document Released: 04/29/2008 Document Revised: 02/03/2012 Document Reviewed: 04/29/2008 °ExitCare® Patient Information ©2015 [employer]. This information is not intended to replace advice given to you by your health care provider. Make sure you discuss any questions you have with your health care provider. ° ° ° °Emergency Department Resource Guide °1) Find a Doctor and Pay Out of Pocket °Although you won't have to find out who is covered by your insurance plan, it is a good idea to ask around and get recommendations. You will then need to call the office and see if the doctor you have chosen will accept you as a new patient and what types of options they offer for patients who are self-pay. Some doctors offer discounts or will set up payment plans for their patients who do not have insurance, but you will need to ask so you aren't surprised when you get to your appointment. ° °2) Contact Your Local Health Department °Not all health departments have doctors that can see patients for sick visits, but many do, so it is worth a call to see if yours does.  If you don't know where your local health department is, you can check in your phone book. The CDC also has a tool to help you locate your state's health department, and many state websites also have listings of all of their local health departments. ° °3) Find a Walk-in Clinic °If your illness is not likely to be very severe or complicated, you may want to try a walk in clinic. These are popping up all over the country in pharmacies, drugstores, and shopping centers. They're usually staffed by nurse practitioners or physician assistants that have been trained to treat common illnesses and complaints. They're usually fairly quick and inexpensive. However, if you have serious medical issues or chronic medical problems, these are probably not your best option. ° °No Primary Care Doctor: °- Call [facility] at  832-8000 - they can help you locate a primary care doctor that  accepts your insurance, provides certain services, etc. °- Physician Referral Service- 1-800-533-3463 ° °Chronic Pain Problems: °Organization         Address  Phone   Notes  °[facility]  (336) 297-2271 Patients need to be referred by their primary care doctor.  ° °Medication Assistance: °Organization         Address  Phone   Notes  °[place] [facility] [address] °[place], [place] [address] (336) 641-8030 --Must be a resident   of [place] °-- Must have NO insurance coverage whatsoever (no Medicaid/ Medicare, etc.) °-- The pt. MUST have a primary care doctor that directs their care regularly and follows them in the community °  °[facility]  (866) 331-1348   °[employer]  (888) 892-1162   ° °Agencies that provide inexpensive medical care: °Organization         Address  Phone   Notes  °[facility]  (336) 832-8035   °[facility]    (336) 832-7272   °[facility] [address] °[place], [place] [address] (336) 832-4777   °[facility] [address], °[place] (336) 271-4999   °[facility]    (336) 373-0678   °[facility]    (336) 272-1050   °[facility] ° [address], [place] Phone:  (336) 832-4444, Fax:  (336) 832-4440 Hours of Operation:  9 am - 6 pm, M-F.  Also accepts Medicaid/Medicare and self-pay.  °[facility] ° [address], [place] Phone: (336) 832-3150, Fax: (336) 832-3151. Hours of Operation:  8:30 am - 5:30 pm, M-F.  Also accepts Medicaid and self-pay.  °[facility] [place] [address], [place] Phone: (336) 878-6027   °[facility] [address], [place], [place] (336)723-1848, Ext. 123 Mondays & Thursdays: 7-9 AM.  First 15 patients are seen on a first come, first serve basis. °  ° °Medicaid-accepting [place] Providers: ° °Organization         Address  Phone   Notes  °[facility] [address], [place] (336) 641-2100 Also accepts self-pay patients.  °[facility] [address], [place] ° (336) 856-9996   °[facility] [address], [place] (336) 288-8857   °[facility] [address] [place] [address], [place] (336) 299-7000   °Veita Bland [address], [place]  ° (336) 373-1557 Only accepts [place] Access Medicaid patients after they have their name applied to their card.  ° °Self-Pay (no insurance) in [place]: ° °Organization         Address  Phone   Notes  °Sickle Cell Patients, [facility] [address], [place] (336) 832-1970   °[facility] [address], [place] (336) 832-4400   °[facility] ° [address],  (336) 992-4800   °[facility]/Dr. Osei-Bonsu ° [address] [place] [address], [place] or [address], [place] (336) 841-8500  Phone number for both [place] and [place] locations is the same.  °[facility] [address], [place] (336) 299-0000   °[facility] [address] [place] [address], [place] or [address] (336) 852-7530 °(336) 878-2260   °[facility] [address], [place] (336) 350-1642, phone; (336) 294-5005, fax Sees patients 1st and 3rd Saturday of every month.  Must not qualify for public or private insurance (i.e. Medicaid, Medicare, [place] Health Choice, Veterans' Benefits) • Household income should be no more than 200% of the poverty level •The clinic cannot treat you if you are pregnant or think you are pregnant • Sexually transmitted diseases are not treated at the clinic.  ° ° °Dental Care: °Organization         Address    Phone  Notes  °[facility] [address], [place] (336) 641-6152 Accepts children up to age 21 who are enrolled in Medicaid or [place] Health Choice; pregnant women with a Medicaid card; and children who have applied for Medicaid or [place] Health Choice, but were declined, whose parents can pay a reduced fee at time of service.  °[facility] [place]  [address], [place] (336) 641-7733 Accepts children up to age 21 who are enrolled in Medicaid or [place] Health Choice; pregnant women with a Medicaid card; and children who have applied for Medicaid or [place] Health Choice, but were declined, whose parents can pay a reduced fee at time of service.  °[facility] ° [address], [place] (336) 641-4533 Patients are seen by appointment only. Walk-ins are not accepted. [facility] will see patients 18 years of age and older. °Monday - Tuesday (8am-5pm) °Most Wednesdays (8:30-5pm) °$30 per visit, cash only  °[facility] ° [address], [place] (336) 641-4533 Patients are seen by appointment  only. Walk-ins are not accepted. [facility] will see patients 18 years of age and older. °One Wednesday Evening (Monthly: Volunteer Based).  $30 per visit, cash only  °[facility]  (919) 537-3737 for adults; Children under age 4, call Graduate Pediatric Dentistry at (919) [phone]. Children aged 4-14, please call (919) 537-3737 to request a pediatric application. ° Dental services are provided in all areas of dental care including fillings, crowns and bridges, complete and partial dentures, implants, gum treatment, root canals, and extractions. Preventive care is also provided. Treatment is provided to both adults and children. °Patients are selected via a lottery and there is often a waiting list. °  °[facility] [address], °[place] ° (336) 763-8833 [email or web address] °  °[facility] [address], [place], [place] (336)723-1848, Ext. 123 Second and Fourth Thursday of each month, opens at 6:30 AM; Clinic ends at 9 AM.  Patients are seen on a first-come first-served basis, and a limited number are seen during each clinic.  ° °[facility] ° [address], [place], [place] (336) 723-7904   Eligibility Requirements °You must have lived in [place], [place], or [place] counties for at least the last three months. °  You cannot be eligible for state or federal sponsored healthcare insurance, including Veterans Administration, Medicaid, or Medicare. °  You generally cannot be eligible for healthcare insurance through your employer.  °  How to apply: °Eligibility screenings are held every Tuesday and Wednesday afternoon from 1:00 pm until 4:00 pm. You do not need an appointment for the interview!  °[facility] [address], [place], [place] 336-631-2330   °[facility]  336-342-8273   °[facility]  336-703-3100   °[facility]  336-570-6415   ° °Behavioral Health  Resources in the Community: °Intensive Outpatient Programs °Organization         Address  Phone  Notes  °[facility] [address], [place], [place] 336-878-6098   °[facility] [address], [place], [place] 336-832-9800   °[facility] [address], [place],  ° 336-882-2125   °[facility] [address],  °[place],  1-800-853-5163 or 336-641-4981   °Substance Abuse Resources °Organization           Address  Phone  Notes  °[facility]  336-882-2125   °[facility]  336-784-9470   °[facility]  336-285-9073   °[facility]  336-845-3988   °Residential & Outpatient Substance Abuse Program  1-800-659-3381   °Psychological Services °Organization         Address  Phone  Notes  °[facility]  336- 832-9600   °[facility]  336- 378-7881   °[facility] [address], [place] 1-800-853-5163 or 336-641-4981   ° °Mobile Crisis Teams °Organization         Address  Phone  Notes  °[facility], Mobile Crisis Care Unit  1-877-626-1772   °Assertive °[facility] ° [address] [place], [place] 336-834-9664   °Sharon DeEsch [address] °[place] 336-554-5454   ° °Self-Help/Support Groups °Organization         Address  Phone             Notes  °[facility] [place] - variety of support groups  336- 373-1402 Call for more information  °Narcotics Anonymous (NA), [facility] [address], °[place]  2 meetings at this location  ° °Residential Treatment Programs °Organization         Address  Phone  Notes  °[facility] [address],    °[place]  1-866-801-8205   °[facility] ° [address], [place], [place] 704-293-8524   °[facility] [address], [place] 336-845-3988 Admissions: 8am-3pm M-F  °[facility] [address]    °[place], [place] 336-841-1104   °[facility] [address], [place], [place] 336-379-7146   °[facility] [address]  °[place], [place] 336-285-9073   °[facility] - Intensive Outpatient [address], [place], [place] 336-852-3033   °[facility] [address]  °[place], [place] 1-877-615-2722 or 336-784-9470   °[facility] [address] [place], [place] 336-227-7417 Accepts Medicaid  °[facility] [address]  °[place] 1-800-659-3381 Substance Abuse/Addiction Treatment  ° °[place] Behavioral Health Resources °Organization         Address  Phone  Notes  °[facility]  (888) 581-9988   °Julie Brannon, PhD [address] [place], [place]   (336) 349-5553 or (336) 951-0000   °[facility]   [address] °[place], [place] (336) 349-4454   °[facility] [address], [place], [place] (336) 342-8316 Insurance/Medicaid/sponsorship through [facility]  °[facility] [address] [place] [address]                                    [place],  (336) 342-8316 Therapy/tele-psych/case  °[facility] [address]  ° [place],  (336) 349-2233    °Dr. Arfeen  (336) 349-4544   °[facility] [place]  [employer] [place] [facility] 1) [address],  °2) [address], [place] °3)  [address], [place] (336) 349-3220 °(336) 342-7768 ° °(336) 342-8140   °[place] Child [facility] (336) 342-1394 or (336) 342-3537 (After Hours)    ° °  °

## 2014-06-13 NOTE — ED Provider Notes (Signed)
CSN: [id number]     Arrival date & time 06/13/14  0935 History   First MD Initiated Contact with Patient 06/13/14 [phone]     Chief Complaint  Patient presents with  . Dental Pain   Johnathan Welch is a 33 y.o. male who presents to the Emergency Department complaining of worsening of his chronic dental pain.  Pt reports hx of multiple dental caries and pyorrhea, he has seen a dentist in the past, but does not have the finances to have dental extractions performed.  (Consider location/radiation/quality/duration/timing/severity/associated sxs/prior Treatment) Patient is a 33 y.o. male presenting with tooth pain. The history is provided by the patient.  Dental Pain Location:  Upper Upper teeth location:  7/RU lateral incisor, 6/RU cuspid and 5/RU 1st bicuspid Quality:  Throbbing and shooting Severity:  Severe Onset quality:  Gradual Duration:  2 days Timing:  Constant Progression:  Worsening Chronicity:  Chronic Context: dental caries and poor dentition   Context: not abscess, not recent dental surgery and not trauma   Relieved by:  Nothing Worsened by:  Cold food/drink and hot food/drink Ineffective treatments:  None tried Associated symptoms: facial pain and gum swelling   Associated symptoms: no congestion, no difficulty swallowing, no drooling, no facial swelling, no fever, no headaches, no neck pain, no neck swelling, no oral bleeding, no oral lesions and no trismus   Risk factors: lack of dental care, periodontal disease and smoking   Risk factors: no diabetes     History reviewed. No pertinent past medical history. History reviewed. No pertinent past surgical history. No family history on file. History  Substance Use Topics  . Smoking status: Current Every Day Smoker -- 0.50 packs/day for 1.5 years    Types: Cigarettes  . Smokeless tobacco: Never Used  . Alcohol Use: No    Review of Systems  Constitutional: Negative for fever and appetite change.  HENT: Positive for  dental problem. Negative for congestion, drooling, ear pain, facial swelling, mouth sores, sore throat and trouble swallowing.   Eyes: Negative for pain and visual disturbance.  Musculoskeletal: Negative for neck pain and neck stiffness.  Neurological: Negative for dizziness, facial asymmetry and headaches.  Hematological: Negative for adenopathy.  All other systems reviewed and are negative.     Allergies  Review of patient's allergies indicates no known allergies.  Home Medications   Prior to Admission medications   Medication Sig Start Date End Date Taking? Authorizing Provider  amoxicillin (AMOXIL) 500 MG capsule Take 1 capsule (500 mg total) by mouth 3 (three) times daily. 11/10/13   Tatyana A Kirichenko, PA-C  amoxicillin (AMOXIL) 500 MG capsule Take 1 capsule (500 mg total) by mouth 3 (three) times daily. 03/27/14   Kathie DikeHobson M Bryant, PA-C  ibuprofen (ADVIL,MOTRIN) 600 MG tablet Take 1 tablet (600 mg total) by mouth every 6 (six) hours as needed. 02/04/14   Shon Batonourtney F Horton, MD  ibuprofen (ADVIL,MOTRIN) 800 MG tablet Take 1 tablet (800 mg total) by mouth 3 (three) times daily. 11/10/13   Tatyana A Kirichenko, PA-C  ibuprofen (ADVIL,MOTRIN) 800 MG tablet Take 1 tablet (800 mg total) by mouth 3 (three) times daily. 03/27/14   Kathie DikeHobson M Bryant, PA-C  oxyCODONE-acetaminophen (PERCOCET/ROXICET) 5-325 MG per tablet Take by mouth every 4 (four) hours as needed for severe pain.    Historical Provider, MD  traMADol (ULTRAM) 50 MG tablet 1 or 2 po q6h prn pain 03/27/14   Kathie DikeHobson M Bryant, PA-C   BP 143/64  Pulse 67  Temp(Src) 98.1 F (36.7 C) (Oral)  Resp 18  Ht 5\' 6"  (1.676 m)  Wt 150 lb (68.04 kg)  BMI 24.22 kg/m2  SpO2 100% Physical Exam  Nursing note and vitals reviewed. Constitutional: He is oriented to person, place, and time. He appears well-developed and well-nourished. No distress.  HENT:  Head: Normocephalic and atraumatic.  Right Ear: Tympanic membrane and ear canal normal.   Left Ear: Tympanic membrane and ear canal normal.  Mouth/Throat: Uvula is midline, oropharynx is clear and moist and mucous membranes are normal. No trismus in the jaw. Dental caries present. No dental abscesses or uvula swelling.  Dental caries and ttp of # 5, 6 and 7.  No facial swelling, obvious dental abscess, trismus, or sublingual abnml. Every tooth is decayed to the gumline and blackened.    Neck: Normal range of motion. Neck supple.  Cardiovascular: Normal rate, regular rhythm, normal heart sounds and intact distal pulses.   No murmur heard. Pulmonary/Chest: Effort normal and breath sounds normal. No respiratory distress.  Musculoskeletal: Normal range of motion.  Lymphadenopathy:    He has no cervical adenopathy.  Neurological: He is alert and oriented to person, place, and time. He exhibits normal muscle tone. Coordination normal.  Skin: Skin is warm and dry.    ED Course  Procedures (including critical care time) Labs Review Labs Reviewed - No data to display  Imaging Review No results found.   EKG Interpretation None      MDM   Final diagnoses:  Pain, dental   Vital signs are stable. Patient is nontoxic appearing. No concerning symptoms for infection to the for of the mouth or deep structures of the neck. Patient has been seen here multiple times for chronic dental pain.   Patient reviewed on the [facility] narcotics database. No recent narcotic prescriptions filed.  Talishia Betzler L. Shedrick Sarli, PA-C 06/13/14 1024

## 2014-06-20 NOTE — ED Provider Notes (Signed)
Medical screening examination/treatment/procedure(s) were performed by non-physician practitioner and as supervising physician I was immediately available for consultation/collaboration.   EKG Interpretation None        Rolland PorterMark Loxley Cibrian, MD 06/20/14 0010

## 2014-09-27 ENCOUNTER — Encounter (HOSPITAL_COMMUNITY): Payer: Self-pay | Admitting: Emergency Medicine

## 2014-09-27 ENCOUNTER — Emergency Department (HOSPITAL_COMMUNITY)
Admission: EM | Admit: 2014-09-27 | Discharge: 2014-09-27 | Disposition: A | Discharge status: Home / Self Care | Payer: Self-pay | Attending: Emergency Medicine | Admitting: Emergency Medicine

## 2014-09-27 DIAGNOSIS — K088 Other specified disorders of teeth and supporting structures: Secondary | ICD-10-CM | POA: Insufficient documentation

## 2014-09-27 DIAGNOSIS — K0889 Other specified disorders of teeth and supporting structures: Secondary | ICD-10-CM

## 2014-09-27 DIAGNOSIS — Z720 Tobacco use: Secondary | ICD-10-CM | POA: Insufficient documentation

## 2014-09-27 MED ORDER — TRAMADOL HCL 50 MG PO TABS: 50.0000 mg | ORAL_TABLET | Freq: Four times a day (QID) | ORAL | Status: DC | PRN

## 2014-09-27 MED ORDER — AMOXICILLIN 500 MG PO CAPS: 500.0000 mg | ORAL_CAPSULE | Freq: Three times a day (TID) | ORAL | Status: DC

## 2014-09-27 MED ORDER — IBUPROFEN 800 MG PO TABS: 800.0000 mg | ORAL_TABLET | Freq: Three times a day (TID) | ORAL | Status: DC

## 2014-09-27 MED ORDER — ONDANSETRON HCL 4 MG PO TABS
4.0000 mg | ORAL_TABLET | Freq: Once | ORAL | Status: AC
Start: 2014-09-27 — End: 2014-09-27
  Administered 2014-09-27: 4 mg via ORAL
  Filled 2014-09-27: qty 1

## 2014-09-27 MED ORDER — IBUPROFEN 800 MG PO TABS
800.0000 mg | ORAL_TABLET | Freq: Once | ORAL | Status: AC
Start: 2014-09-27 — End: 2014-09-27
  Administered 2014-09-27: 800 mg via ORAL
  Filled 2014-09-27: qty 1

## 2014-09-27 MED ORDER — AMOXICILLIN 250 MG PO CAPS
1000.0000 mg | ORAL_CAPSULE | Freq: Once | ORAL | Status: AC
Start: 2014-09-27 — End: 2014-09-27
  Administered 2014-09-27: 1000 mg via ORAL
  Filled 2014-09-27: qty 4

## 2014-09-27 MED ORDER — ACETAMINOPHEN 325 MG PO TABS
650.0000 mg | ORAL_TABLET | Freq: Once | ORAL | Status: AC
Start: 2014-09-27 — End: 2014-09-27
  Administered 2014-09-27: 650 mg via ORAL
  Filled 2014-09-27: qty 2

## 2014-09-27 NOTE — ED Notes (Signed)
Pt c/o left side dental pain x 2 days.  Pt denies trouble breathing/N/V/chest pain.

## 2014-09-27 NOTE — Discharge Instructions (Signed)

## 2014-09-27 NOTE — ED Provider Notes (Signed)
CSN: [id number]     Arrival date & time 09/27/14  0734 History   First MD Initiated Contact with Patient 09/27/14 0800     Chief Complaint  Patient presents with  . Dental Pain     (Consider location/radiation/quality/duration/timing/severity/associated sxs/prior Treatment) Patient is a 33 y.o. male presenting with tooth pain. The history is provided by the patient.  Dental Pain Severity:  Moderate Onset quality:  Gradual Duration:  2 days Timing:  Intermittent Progression:  Worsening Chronicity:  Chronic Context: dental caries and poor dentition   Relieved by:  Nothing Worsened by:  Cold food/drink Ineffective treatments:  Acetaminophen Associated symptoms: facial pain and gum swelling   Associated symptoms: no difficulty swallowing, no fever and no neck pain   Risk factors: smoking     History reviewed. No pertinent past medical history. History reviewed. No pertinent past surgical history. History reviewed. No pertinent family history. History  Substance Use Topics  . Smoking status: Current Every Day Smoker -- 0.50 packs/day for 1.5 years    Types: Cigarettes  . Smokeless tobacco: Never Used  . Alcohol Use: No    Review of Systems  Constitutional: Negative for fever and activity change.       All ROS Neg except as noted in HPI  Eyes: Negative for photophobia and discharge.  Respiratory: Negative for cough, shortness of breath and wheezing.   Cardiovascular: Negative for chest pain and palpitations.  Gastrointestinal: Negative for abdominal pain and blood in stool.  Genitourinary: Negative for dysuria, frequency and hematuria.  Musculoskeletal: Negative for back pain, arthralgias and neck pain.  Skin: Negative.   Neurological: Negative for dizziness, seizures and speech difficulty.  Psychiatric/Behavioral: Negative for hallucinations and confusion.      Allergies  Review of patient's allergies indicates no known allergies.  Home Medications   Prior to  Admission medications   Medication Sig Start Date End Date Taking? Authorizing Provider  amoxicillin (AMOXIL) 500 MG capsule Take 1 capsule (500 mg total) by mouth 3 (three) times daily. 11/10/13   Tatyana A Kirichenko, PA-C  amoxicillin (AMOXIL) 500 MG capsule Take 1 capsule (500 mg total) by mouth 3 (three) times daily. 03/27/14   Kathie DikeHobson M Darius Fillingim, PA-C  amoxicillin (AMOXIL) 500 MG capsule Take 1 capsule (500 mg total) by mouth 3 (three) times daily. 06/13/14   Tammy L. Triplett, PA-C  HYDROcodone-acetaminophen (NORCO/VICODIN) 5-325 MG per tablet Take one-two tabs po q 4-6 hrs prn pain 06/13/14   Tammy L. Triplett, PA-C  ibuprofen (ADVIL,MOTRIN) 600 MG tablet Take 1 tablet (600 mg total) by mouth every 6 (six) hours as needed. 02/04/14   Shon Batonourtney F Horton, MD  ibuprofen (ADVIL,MOTRIN) 800 MG tablet Take 1 tablet (800 mg total) by mouth 3 (three) times daily. 11/10/13   Tatyana A Kirichenko, PA-C  ibuprofen (ADVIL,MOTRIN) 800 MG tablet Take 1 tablet (800 mg total) by mouth 3 (three) times daily. 03/27/14   Kathie DikeHobson M Diquan Kassis, PA-C  oxyCODONE-acetaminophen (PERCOCET/ROXICET) 5-325 MG per tablet Take by mouth every 4 (four) hours as needed for severe pain.    Historical Provider, MD  traMADol (ULTRAM) 50 MG tablet 1 or 2 po q6h prn pain 03/27/14   Kathie DikeHobson M Savino Whisenant, PA-C   BP 134/86 mmHg  Pulse 77  Temp(Src) 98.4 F (36.9 C) (Oral)  Resp 16  Ht 5\' 6"  (1.676 m)  Wt 150 lb (68.04 kg)  BMI 24.22 kg/m2  SpO2 100% Physical Exam  Constitutional: He is oriented to person, place, and time. He appears  well-developed and well-nourished.  Non-toxic appearance.  HENT:  Head: Normocephalic.  Right Ear: Tympanic membrane and external ear normal.  Left Ear: Tympanic membrane and external ear normal.  Teeth of the upper and lower jaw are decayed to the gum line. There is swelling and tenderness to palpation of the left lower jaw. No visible abscess. No swelling under the tongue. The airway is patent.  Eyes: EOM and lids  are normal. Pupils are equal, round, and reactive to light.  Neck: Normal range of motion. Neck supple. Carotid bruit is not present.  Cardiovascular: Normal rate, regular rhythm, normal heart sounds, intact distal pulses and normal pulses.   Pulmonary/Chest: Breath sounds normal. No respiratory distress.  Abdominal: Soft. Bowel sounds are normal. There is no tenderness. There is no guarding.  Musculoskeletal: Normal range of motion.  Lymphadenopathy:       Head (right side): No submandibular adenopathy present.       Head (left side): No submandibular adenopathy present.    He has no cervical adenopathy.  Neurological: He is alert and oriented to person, place, and time. He has normal strength. No cranial nerve deficit or sensory deficit.  Skin: Skin is warm and dry.  Psychiatric: He has a normal mood and affect. His speech is normal.  Nursing note and vitals reviewed.   ED Course  Procedures (including critical care time) Labs Review Labs Reviewed - No data to display  Imaging Review No results found.   EKG Interpretation None      MDM  Pt has chronic dental issues. He has not had temp elevation and no tachycardia noted on exam today. Rx for amoxil, tramadol, and ibuprofen given.  Pt strongly encouraged to see a dentist as soon as possible.   Final diagnoses:  Toothache    **I have reviewed nursing notes, vital signs, and all appropriate lab and imaging results for this patient.Kathie Dike*    Inella Kuwahara M Normal Recinos, PA-C 09/27/14 [phone]

## 2014-10-28 ENCOUNTER — Emergency Department (HOSPITAL_COMMUNITY)
Admission: EM | Admit: 2014-10-28 | Discharge: 2014-10-28 | Disposition: A | Discharge status: Home / Self Care | Payer: Self-pay | Attending: Emergency Medicine | Admitting: Emergency Medicine

## 2014-10-28 ENCOUNTER — Encounter (HOSPITAL_COMMUNITY): Payer: Self-pay | Admitting: Emergency Medicine

## 2014-10-28 DIAGNOSIS — Z792 Long term (current) use of antibiotics: Secondary | ICD-10-CM | POA: Insufficient documentation

## 2014-10-28 DIAGNOSIS — Z79899 Other long term (current) drug therapy: Secondary | ICD-10-CM | POA: Insufficient documentation

## 2014-10-28 DIAGNOSIS — G8929 Other chronic pain: Secondary | ICD-10-CM | POA: Insufficient documentation

## 2014-10-28 DIAGNOSIS — K029 Dental caries, unspecified: Secondary | ICD-10-CM

## 2014-10-28 DIAGNOSIS — K088 Other specified disorders of teeth and supporting structures: Secondary | ICD-10-CM | POA: Insufficient documentation

## 2014-10-28 DIAGNOSIS — Z720 Tobacco use: Secondary | ICD-10-CM | POA: Insufficient documentation

## 2014-10-28 HISTORY — DX: Other chronic pain: G89.29

## 2014-10-28 HISTORY — DX: Disorder of teeth and supporting structures, unspecified: K08.9

## 2014-10-28 MED ORDER — IBUPROFEN 800 MG PO TABS: 800.0000 mg | ORAL_TABLET | Freq: Three times a day (TID) | ORAL | Status: DC

## 2014-10-28 MED ORDER — IBUPROFEN 800 MG PO TABS
800.0000 mg | ORAL_TABLET | Freq: Once | ORAL | Status: AC
Start: 2014-10-28 — End: 2014-10-28
  Administered 2014-10-28: 800 mg via ORAL

## 2014-10-28 MED ORDER — AMOXICILLIN 250 MG PO CAPS
500.0000 mg | ORAL_CAPSULE | Freq: Once | ORAL | Status: AC
Start: 2014-10-28 — End: 2014-10-28
  Administered 2014-10-28: 500 mg via ORAL

## 2014-10-28 MED ORDER — AMOXICILLIN 500 MG PO CAPS: 500.0000 mg | ORAL_CAPSULE | Freq: Three times a day (TID) | ORAL | Status: DC

## 2014-10-28 MED ORDER — ACETAMINOPHEN 500 MG PO TABS
1000.0000 mg | ORAL_TABLET | Freq: Once | ORAL | Status: AC
  Administered 2014-10-28: 1000 mg via ORAL

## 2014-10-28 MED ORDER — ACETAMINOPHEN-CODEINE #3 300-30 MG PO TABS: 1.0000 | ORAL_TABLET | Freq: Four times a day (QID) | ORAL | Status: DC | PRN

## 2014-10-28 NOTE — ED Provider Notes (Signed)
CSN: [id number]     Arrival date & time 10/28/14  0932 History   First MD Initiated Contact with Patient 10/28/14 [phone]     Chief Complaint  Patient presents with  . Dental Pain     (Consider location/radiation/quality/duration/timing/severity/associated sxs/prior Treatment) Patient is a 33 y.o. male presenting with tooth pain. The history is provided by the patient.  Dental Pain Location:  Lower Quality:  Throbbing Severity:  Moderate Onset quality:  Gradual Duration:  2 days Timing:  Intermittent Progression:  Worsening Chronicity:  Chronic Context: dental caries and poor dentition   Relieved by:  Nothing Worsened by:  Cold food/drink Ineffective treatments:  Acetaminophen Associated symptoms: gum swelling   Associated symptoms: no difficulty swallowing, no drooling, no fever, no neck pain and no trismus   Risk factors: lack of dental care and smoking   Risk factors: no diabetes     Past Medical History  Diagnosis Date  . Chronic dental pain    History reviewed. No pertinent past surgical history. History reviewed. No pertinent family history. History  Substance Use Topics  . Smoking status: Current Every Day Smoker -- 0.50 packs/day for 1.5 years    Types: Cigarettes  . Smokeless tobacco: Never Used  . Alcohol Use: No    Review of Systems  Constitutional: Negative for fever and activity change.       All ROS Neg except as noted in HPI  HENT: Positive for dental problem. Negative for drooling.   Eyes: Negative for photophobia and discharge.  Respiratory: Negative for cough, shortness of breath and wheezing.   Cardiovascular: Negative for chest pain and palpitations.  Gastrointestinal: Negative for abdominal pain and blood in stool.  Genitourinary: Negative for dysuria, frequency and hematuria.  Musculoskeletal: Negative for back pain, arthralgias and neck pain.  Skin: Negative.   Neurological: Negative for dizziness, seizures and speech difficulty.   Psychiatric/Behavioral: Negative for hallucinations and confusion.      Allergies  Tramadol  Home Medications   Prior to Admission medications   Medication Sig Start Date End Date Taking? Authorizing Provider  acetaminophen (TYLENOL) 500 MG tablet Take 1,000 mg by mouth every 6 (six) hours as needed for moderate pain.    Historical Provider, MD  amoxicillin (AMOXIL) 500 MG capsule Take 1 capsule (500 mg total) by mouth 3 (three) times daily. 09/27/14   Kathie DikeHobson M Chinaza Rooke, PA-C  HYDROcodone-acetaminophen (NORCO/VICODIN) 5-325 MG per tablet Take one-two tabs po q 4-6 hrs prn pain 06/13/14   Tammy L. Triplett, PA-C  ibuprofen (ADVIL,MOTRIN) 800 MG tablet Take 1 tablet (800 mg total) by mouth 3 (three) times daily. 09/27/14   Kathie DikeHobson M Lavelle Berland, PA-C  oxyCODONE-acetaminophen (PERCOCET/ROXICET) 5-325 MG per tablet Take by mouth every 4 (four) hours as needed for severe pain.    Historical Provider, MD  traMADol (ULTRAM) 50 MG tablet Take 1 tablet (50 mg total) by mouth every 6 (six) hours as needed. 09/27/14   Kathie DikeHobson M Savien Mamula, PA-C   BP 148/97 mmHg  Pulse 81  Temp(Src) 97.6 F (36.4 C) (Oral)  Resp 18  Ht 5\' 6"  (1.676 m)  Wt 150 lb (68.04 kg)  BMI 24.22 kg/m2  SpO2 100% Physical Exam  Constitutional: He is oriented to person, place, and time. He appears well-developed and well-nourished.  Non-toxic appearance.  HENT:  Head: Normocephalic.  Right Ear: Tympanic membrane and external ear normal.  Left Ear: Tympanic membrane and external ear normal.  Multiple dental caries noted of the upper and lower jaw area.  There is swelling of the upper and lower gums. There are some whitehead lesions of the gum of the lower jaw on, more of the incisor area than other areas of the jaw and gum area. The airway is patent. There is no trismus noted. There is no swelling under the tongue, and the tissue under the tongue is soft.  Eyes: EOM and lids are normal. Pupils are equal, round, and reactive to light.   Neck: Normal range of motion. Neck supple. Carotid bruit is not present.  Few small submental nodes present  Trachea is midline.  Cardiovascular: Normal rate, regular rhythm, normal heart sounds, intact distal pulses and normal pulses.   Pulmonary/Chest: Breath sounds normal. No respiratory distress.  Abdominal: Soft. Bowel sounds are normal. There is no tenderness. There is no guarding.  Musculoskeletal: Normal range of motion.  Lymphadenopathy:       Head (right side): No submandibular adenopathy present.       Head (left side): No submandibular adenopathy present.    He has no cervical adenopathy.  Neurological: He is alert and oriented to person, place, and time. He has normal strength. No cranial nerve deficit or sensory deficit.  Skin: Skin is warm and dry.  Psychiatric: He has a normal mood and affect. His speech is normal.  Nursing note and vitals reviewed.   ED Course  Procedures (including critical care time) Labs Review Labs Reviewed - No data to display  Imaging Review No results found.   EKG Interpretation None      MDM  Vital signs are within normal limits with exception of the blood pressure being slightly elevated. Pulse oximetry is 100% on room air. Within normal limits by my interpretation.  The patient has multiple dental caries present. No evidence for abscess. There is no trismus noted. And there is no evidence for Ludwig's  angina.   Patient strongly encouraged to see a dentist as soon as possible. Also to see the physicians at the local health department or the [facility] to establish primary physician who can help him with his discomfort while he is getting his dental work done. I've also suggested to the patient the possibility of [facility] [employer] [facility]. Prescription for Amoxil and Tylenol codeine given to the patient.    Final diagnoses:  None    **I have reviewed nursing notes, vital signs,  and all appropriate lab and imaging results for this patient.Kathie Dike*    Kincade Granberg M Syesha Thaw, PA-C 10/28/14 1023  Samuel JesterKathleen McManus, DO 10/30/14 1314

## 2014-10-28 NOTE — Discharge Instructions (Signed)
It is important that you see a dentist as soon as possible. Please take amoxicillin ibuprofen 3 times daily until all taken. Use Tylenol codeine every 6 hours if needed for pain. Please call the local health department, or the [facility] to establish a primary care to assist you with ear pain. Dental Pain Toothache is pain in or around a tooth. It may get worse with chewing or with cold or heat.  HOME CARE  Your dentist may use a numbing medicine during treatment. If so, you may need to avoid eating until the medicine wears off. Ask your dentist about this.  Only take medicine as told by your dentist or doctor.  Avoid chewing food near the painful tooth until after all treatment is done. Ask your dentist about this. GET HELP RIGHT AWAY IF:   The problem gets worse or new problems appear.  You have a fever.  There is redness and puffiness (swelling) of the face, jaw, or neck.  You cannot open your mouth.  There is pain in the jaw.  There is very bad pain that is not helped by medicine. MAKE SURE YOU:   Understand these instructions.  Will watch your condition.  Will get help right away if you are not doing well or get worse. Document Released: 04/29/2008 Document Revised: 02/03/2012 Document Reviewed: 04/29/2008 [facility] Patient Information 2015 [employer] [place]. This information is not intended to replace advice given to you by your health care provider. Make sure you discuss any questions you have with your health care provider.

## 2014-10-28 NOTE — ED Notes (Signed)
Lower jaw (tooth pain.  Rates pain 9.  Took ibuprofen with no relief.

## 2014-11-10 ENCOUNTER — Encounter (HOSPITAL_COMMUNITY): Payer: Self-pay | Admitting: Emergency Medicine

## 2014-11-10 ENCOUNTER — Emergency Department (HOSPITAL_COMMUNITY)
Admission: EM | Admit: 2014-11-10 | Discharge: 2014-11-10 | Disposition: A | Discharge status: Home / Self Care | Payer: Self-pay | Attending: Emergency Medicine | Admitting: Emergency Medicine

## 2014-11-10 DIAGNOSIS — Z720 Tobacco use: Secondary | ICD-10-CM | POA: Insufficient documentation

## 2014-11-10 DIAGNOSIS — K029 Dental caries, unspecified: Secondary | ICD-10-CM | POA: Insufficient documentation

## 2014-11-10 DIAGNOSIS — Z792 Long term (current) use of antibiotics: Secondary | ICD-10-CM | POA: Insufficient documentation

## 2014-11-10 DIAGNOSIS — K088 Other specified disorders of teeth and supporting structures: Secondary | ICD-10-CM | POA: Insufficient documentation

## 2014-11-10 DIAGNOSIS — K0889 Other specified disorders of teeth and supporting structures: Secondary | ICD-10-CM

## 2014-11-10 DIAGNOSIS — G8929 Other chronic pain: Secondary | ICD-10-CM | POA: Insufficient documentation

## 2014-11-10 DIAGNOSIS — Z791 Long term (current) use of non-steroidal anti-inflammatories (NSAID): Secondary | ICD-10-CM | POA: Insufficient documentation

## 2014-11-10 DIAGNOSIS — Z79899 Other long term (current) drug therapy: Secondary | ICD-10-CM | POA: Insufficient documentation

## 2014-11-10 MED ORDER — CLINDAMYCIN HCL 150 MG PO CAPS: 150.0000 mg | ORAL_CAPSULE | Freq: Four times a day (QID) | ORAL | Status: DC

## 2014-11-10 MED ORDER — IBUPROFEN 800 MG PO TABS: 800.0000 mg | ORAL_TABLET | Freq: Three times a day (TID) | ORAL | Status: DC

## 2014-11-10 NOTE — ED Notes (Signed)
Patient c/o left lower dental pain. Per patient went to Dr Malachi ParadiseJoe Adams office today but unable to see him till next week and was told to come here. Patient reports taking ibuprofen with no relief.

## 2014-11-10 NOTE — Discharge Instructions (Signed)

## 2014-11-10 NOTE — ED Provider Notes (Signed)
CSN: [id number]     Arrival date & time 11/10/14  1535 History   First MD Initiated Contact with Patient 11/10/14 1607     Chief Complaint  Patient presents with  . Dental Pain     (Consider location/radiation/quality/duration/timing/severity/associated sxs/prior Treatment) HPI   Tereso NewcomerMichael S Can is a 33 y.o. male who presents to the Emergency Department complaining of dental pain.  He states he has pain to several teeth along the left lower jaw.  Symptoms are similar to previous.  He states that he has tried to see a Education officer, communitydentist, but was advised to come here because the dentist was unable to see him until next week. He denies fever, chills, facial swelling or neck pain. He has tried OTC medications without relief.      Past Medical History  Diagnosis Date  . Chronic dental pain    History reviewed. No pertinent past surgical history. History reviewed. No pertinent family history. History  Substance Use Topics  . Smoking status: Current Every Day Smoker -- 0.50 packs/day for 1.5 years    Types: Cigarettes  . Smokeless tobacco: Never Used  . Alcohol Use: No    Review of Systems  Constitutional: Negative for fever and appetite change.  HENT: Positive for dental problem. Negative for congestion, facial swelling, sore throat and trouble swallowing.   Eyes: Negative for pain and visual disturbance.  Musculoskeletal: Negative for neck pain and neck stiffness.  Neurological: Negative for dizziness, facial asymmetry and headaches.  Hematological: Negative for adenopathy.  All other systems reviewed and are negative.     Allergies  Tramadol  Home Medications   Prior to Admission medications   Medication Sig Start Date End Date Taking? Authorizing Provider  acetaminophen-codeine (TYLENOL #3) 300-30 MG per tablet Take 1-2 tablets by mouth every 6 (six) hours as needed for moderate pain. 10/28/14   Kathie DikeHobson M Bryant, PA-C  amoxicillin (AMOXIL) 500 MG capsule Take 1 capsule (500 mg  total) by mouth 3 (three) times daily. 10/28/14   Kathie DikeHobson M Bryant, PA-C  HYDROcodone-acetaminophen (NORCO/VICODIN) 5-325 MG per tablet Take one-two tabs po q 4-6 hrs prn pain 06/13/14   Kaylenn Civil L. Horald Birky, PA-C  ibuprofen (ADVIL,MOTRIN) 800 MG tablet Take 1 tablet (800 mg total) by mouth 3 (three) times daily. 10/28/14   Kathie DikeHobson M Bryant, PA-C  oxyCODONE-acetaminophen (PERCOCET/ROXICET) 5-325 MG per tablet Take by mouth every 4 (four) hours as needed for severe pain.    Historical Provider, MD  traMADol (ULTRAM) 50 MG tablet Take 1 tablet (50 mg total) by mouth every 6 (six) hours as needed. 09/27/14   Kathie DikeHobson M Bryant, PA-C   BP 140/85 mmHg  Pulse 65  Temp(Src) 98.3 F (36.8 C) (Oral)  Resp 16  Ht 5\' 6"  (1.676 m)  Wt 160 lb (72.576 kg)  BMI 25.84 kg/m2  SpO2 100% Physical Exam  Constitutional: He is oriented to person, place, and time. He appears well-developed and well-nourished. No distress.  HENT:  Head: Normocephalic and atraumatic.  Right Ear: Tympanic membrane and ear canal normal.  Left Ear: Tympanic membrane and ear canal normal.  Mouth/Throat: Uvula is midline, oropharynx is clear and moist and mucous membranes are normal. No trismus in the jaw. Dental caries present. No dental abscesses or uvula swelling.  Tenderness and multiple dental caries of the left lower molars.  Widespread dental decay. No facial swelling, obvious dental abscess, trismus, or sublingual abnml.    Neck: Normal range of motion. Neck supple.  Cardiovascular: Normal rate, regular  rhythm, normal heart sounds and intact distal pulses.   No murmur heard. Pulmonary/Chest: Effort normal and breath sounds normal. No respiratory distress.  Musculoskeletal: Normal range of motion.  Lymphadenopathy:    He has no cervical adenopathy.  Neurological: He is alert and oriented to person, place, and time. He exhibits normal muscle tone. Coordination normal.  Skin: Skin is warm and dry.  Nursing note and vitals  reviewed.   ED Course  Procedures (including critical care time) Labs Review Labs Reviewed - No data to display  Imaging Review No results found.   EKG Interpretation None      MDM   Final diagnoses:  Pain, dental   VSS, pt is well appearing.  Multiple dental caries, multiple ED visits for same.  Will try clindaymycin and ibuprofen.  Pt understands that no further narcotics are indicated at this time.  No concerning sx's for infection to the deep structures of the neck or floor of the mouth.      Mardell Suttles L. Trisha Mangleriplett, PA-C 11/12/14 1629  Toy BakerAnthony T Allen, MD 11/14/14 [phone]

## 2014-12-13 ENCOUNTER — Emergency Department (HOSPITAL_COMMUNITY)
Admission: EM | Admit: 2014-12-13 | Discharge: 2014-12-13 | Disposition: A | Discharge status: Home / Self Care | Payer: Self-pay | Attending: Emergency Medicine | Admitting: Emergency Medicine

## 2014-12-13 ENCOUNTER — Encounter (HOSPITAL_COMMUNITY): Payer: Self-pay | Admitting: Emergency Medicine

## 2014-12-13 DIAGNOSIS — Z79899 Other long term (current) drug therapy: Secondary | ICD-10-CM | POA: Insufficient documentation

## 2014-12-13 DIAGNOSIS — Z791 Long term (current) use of non-steroidal anti-inflammatories (NSAID): Secondary | ICD-10-CM | POA: Insufficient documentation

## 2014-12-13 DIAGNOSIS — K0889 Other specified disorders of teeth and supporting structures: Secondary | ICD-10-CM

## 2014-12-13 DIAGNOSIS — K088 Other specified disorders of teeth and supporting structures: Secondary | ICD-10-CM | POA: Insufficient documentation

## 2014-12-13 DIAGNOSIS — Z792 Long term (current) use of antibiotics: Secondary | ICD-10-CM | POA: Insufficient documentation

## 2014-12-13 DIAGNOSIS — G8929 Other chronic pain: Secondary | ICD-10-CM | POA: Insufficient documentation

## 2014-12-13 DIAGNOSIS — Z720 Tobacco use: Secondary | ICD-10-CM | POA: Insufficient documentation

## 2014-12-13 MED ORDER — AMOXICILLIN 500 MG PO CAPS: 500.0000 mg | ORAL_CAPSULE | Freq: Three times a day (TID) | ORAL | Status: DC

## 2014-12-13 MED ORDER — HYDROCODONE-ACETAMINOPHEN 5-325 MG PO TABS: ORAL_TABLET | ORAL | Status: DC

## 2014-12-13 NOTE — Discharge Instructions (Signed)

## 2014-12-13 NOTE — ED Notes (Signed)
Patient c/o right lower dental pain x4 days. Per patient taken tylenol and motrin with no relief. Per patient earliest dental appointment available that he could schedule was Feb 3rd.

## 2014-12-13 NOTE — ED Provider Notes (Signed)
CSN: [id number]     Arrival date & time 12/13/14  0859 History   First MD Initiated Contact with Patient 12/13/14 [phone]     Chief Complaint  Patient presents with  . Dental Pain     (Consider location/radiation/quality/duration/timing/severity/associated sxs/prior Treatment) Patient is a 34 y.o. male presenting with tooth pain. The history is provided by the patient. No language interpreter was used.  Dental Pain Location:  Upper, lower and generalized Quality:  Aching Severity:  Severe Timing:  Constant Progression:  Worsening Chronicity:  New Relieved by:  Nothing Worsened by:  Cold food/drink Pt has decay every tooth broken into gum line  Past Medical History  Diagnosis Date  . Chronic dental pain    History reviewed. No pertinent past surgical history. History reviewed. No pertinent family history. History  Substance Use Topics  . Smoking status: Current Every Day Smoker -- 0.50 packs/day for 1.5 years    Types: Cigarettes  . Smokeless tobacco: Never Used  . Alcohol Use: No    Review of Systems  All other systems reviewed and are negative.     Allergies  Tramadol  Home Medications   Prior to Admission medications   Medication Sig Start Date End Date Taking? Authorizing Provider  acetaminophen-codeine (TYLENOL #3) 300-30 MG per tablet Take 1-2 tablets by mouth every 6 (six) hours as needed for moderate pain. 10/28/14   Kathie DikeHobson M Bryant, PA-C  amoxicillin (AMOXIL) 500 MG capsule Take 1 capsule (500 mg total) by mouth 3 (three) times daily. 12/13/14   Elson AreasLeslie K Sofia, PA-C  clindamycin (CLEOCIN) 150 MG capsule Take 1 capsule (150 mg total) by mouth 4 (four) times daily. For 10 days 11/10/14   Tammy L. Triplett, PA-C  HYDROcodone-acetaminophen (NORCO/VICODIN) 5-325 MG per tablet Take one-two tabs po q 4-6 hrs prn pain 12/13/14   Elson AreasLeslie K Sofia, PA-C  ibuprofen (ADVIL,MOTRIN) 800 MG tablet Take 1 tablet (800 mg total) by mouth 3 (three) times daily. 11/10/14   Tammy L.  Triplett, PA-C  oxyCODONE-acetaminophen (PERCOCET/ROXICET) 5-325 MG per tablet Take by mouth every 4 (four) hours as needed for severe pain.    Historical Provider, MD  traMADol (ULTRAM) 50 MG tablet Take 1 tablet (50 mg total) by mouth every 6 (six) hours as needed. 09/27/14   Kathie DikeHobson M Bryant, PA-C   BP 140/101 mmHg  Pulse 78  Temp(Src) 97.6 F (36.4 C) (Oral)  Resp 18  Ht 5\' 6"  (1.676 m)  Wt 180 lb (81.647 kg)  BMI 29.07 kg/m2  SpO2 100% Physical Exam  Constitutional: He is oriented to person, place, and time. He appears well-developed and well-nourished.  HENT:  Head: Normocephalic and atraumatic.  Severe decay, black decayed teeth all at gumline.    Eyes: EOM are normal.  Neck: Normal range of motion.  Pulmonary/Chest: Effort normal.  Abdominal: He exhibits no distension.  Musculoskeletal: Normal range of motion.  Neurological: He is alert and oriented to person, place, and time.  Psychiatric: He has a normal mood and affect.  Nursing note and vitals reviewed.   ED Course  Procedures (including critical care time) Labs Review Labs Reviewed - No data to display  Imaging Review No results found.   EKG Interpretation None      MDM  Pt is awaiting oral surgery referral from his primary.  Pt needs all teeth extracted. Rx for hydrocodone and amoxicillian   Final diagnoses:  Toothache       Elson AreasLeslie K Sofia, PA-C 12/13/14 [id number]  Jeannett SeniorStephen  Juleen China, MD 12/13/14 1031

## 2015-01-16 ENCOUNTER — Encounter (HOSPITAL_COMMUNITY): Payer: Self-pay | Admitting: Emergency Medicine

## 2015-01-16 ENCOUNTER — Emergency Department (HOSPITAL_COMMUNITY)
Admission: EM | Admit: 2015-01-16 | Discharge: 2015-01-16 | Disposition: A | Discharge status: Home / Self Care | Payer: Self-pay | Attending: Emergency Medicine | Admitting: Emergency Medicine

## 2015-01-16 DIAGNOSIS — Z79899 Other long term (current) drug therapy: Secondary | ICD-10-CM | POA: Insufficient documentation

## 2015-01-16 DIAGNOSIS — Z792 Long term (current) use of antibiotics: Secondary | ICD-10-CM | POA: Insufficient documentation

## 2015-01-16 DIAGNOSIS — Z791 Long term (current) use of non-steroidal anti-inflammatories (NSAID): Secondary | ICD-10-CM | POA: Insufficient documentation

## 2015-01-16 DIAGNOSIS — K029 Dental caries, unspecified: Secondary | ICD-10-CM | POA: Insufficient documentation

## 2015-01-16 DIAGNOSIS — G8929 Other chronic pain: Secondary | ICD-10-CM | POA: Insufficient documentation

## 2015-01-16 DIAGNOSIS — Z720 Tobacco use: Secondary | ICD-10-CM | POA: Insufficient documentation

## 2015-01-16 DIAGNOSIS — K047 Periapical abscess without sinus: Secondary | ICD-10-CM | POA: Insufficient documentation

## 2015-01-16 MED ORDER — ACETAMINOPHEN-CODEINE #3 300-30 MG PO TABS: 1.0000 | ORAL_TABLET | Freq: Four times a day (QID) | ORAL | Status: DC | PRN

## 2015-01-16 MED ORDER — IBUPROFEN 800 MG PO TABS: 800.0000 mg | ORAL_TABLET | Freq: Three times a day (TID) | ORAL | Status: DC

## 2015-01-16 MED ORDER — IBUPROFEN 800 MG PO TABS
800.0000 mg | ORAL_TABLET | Freq: Once | ORAL | Status: AC
  Administered 2015-01-16: 800 mg via ORAL
  Filled 2015-01-16: qty 1

## 2015-01-16 MED ORDER — AMOXICILLIN 500 MG PO CAPS: 500.0000 mg | ORAL_CAPSULE | Freq: Three times a day (TID) | ORAL | Status: DC

## 2015-01-16 MED ORDER — AMOXICILLIN 250 MG PO CAPS
500.0000 mg | ORAL_CAPSULE | Freq: Once | ORAL | Status: AC
  Administered 2015-01-16: 500 mg via ORAL
  Filled 2015-01-16: qty 2

## 2015-01-16 MED ORDER — ACETAMINOPHEN-CODEINE #3 300-30 MG PO TABS
2.0000 | ORAL_TABLET | Freq: Once | ORAL | Status: AC
  Administered 2015-01-16: 2 via ORAL
  Filled 2015-01-16: qty 2

## 2015-01-16 NOTE — ED Provider Notes (Signed)
CSN: [id number]     Arrival date & time 01/16/15  1035 History  This chart was scribed for Ivery Quale, PA-C with Vida Roller, MD by Tonye Royalty, ED Scribe. This patient was seen in room APFT21/APFT21 and the patient's care was started at 11:45 AM.    Chief Complaint  Patient presents with  . Dental Pain   Patient is a 34 y.o. male presenting with tooth pain. The history is provided by the patient. No language interpreter was used.  Dental Pain Location:  Lower Lower teeth location: left. Quality:  Constant Severity:  Moderate Onset quality:  Sudden Duration:  4 days Timing:  Constant Progression:  Worsening Chronicity:  New Context: dental caries   Relieved by:  Nothing Worsened by:  Nothing tried Ineffective treatments:  NSAIDs and heat Associated symptoms: no fever   Risk factors: smoking     HPI Comments: Johnathan Welch is a 34 y.o. male who presents to the Emergency Department complaining of left lower dental pain with onset 4 days ago. He states he has an appointment with a dentist on March 13. He states he has been using Ibuprofen, hot water bottle, and heating pad without improvement. He denies any significant medical history besides previous appendectomy. He states he does not use any prescription medications every day. He denies fever.  No PCP, per patient  Past Medical History  Diagnosis Date  . Chronic dental pain    Past Surgical History  Procedure Laterality Date  . Appendectomy     History reviewed. No pertinent family history. History  Substance Use Topics  . Smoking status: Current Every Day Smoker -- 0.50 packs/day for 1.5 years    Types: Cigarettes  . Smokeless tobacco: Never Used  . Alcohol Use: No    Review of Systems  Constitutional: Negative for fever.  HENT: Positive for dental problem.   All other systems reviewed and are negative.     Allergies  Tramadol  Home Medications   Prior to Admission medications   Medication Sig  Start Date End Date Taking? Authorizing Provider  ibuprofen (ADVIL,MOTRIN) 200 MG tablet Take 600 mg by mouth every 8 (eight) hours as needed (pain).   Yes Historical Provider, MD  phenytoin (DILANTIN) 100 MG ER capsule Take 100 mg by mouth 2 (two) times daily.   Yes Historical Provider, MD  acetaminophen-codeine (TYLENOL #3) 300-30 MG per tablet Take 1-2 tablets by mouth every 6 (six) hours as needed for moderate pain. Patient not taking: Reported on 01/16/2015 10/28/14   Kathie Dike, PA-C  amoxicillin (AMOXIL) 500 MG capsule Take 1 capsule (500 mg total) by mouth 3 (three) times daily. Patient not taking: Reported on 01/16/2015 12/13/14   Elson Areas, PA-C  clindamycin (CLEOCIN) 150 MG capsule Take 1 capsule (150 mg total) by mouth 4 (four) times daily. For 10 days Patient not taking: Reported on 01/16/2015 11/10/14   Tammy L. Triplett, PA-C  HYDROcodone-acetaminophen (NORCO/VICODIN) 5-325 MG per tablet Take one-two tabs po q 4-6 hrs prn pain Patient not taking: Reported on 01/16/2015 12/13/14   Elson Areas, PA-C  ibuprofen (ADVIL,MOTRIN) 800 MG tablet Take 1 tablet (800 mg total) by mouth 3 (three) times daily. Patient not taking: Reported on 01/16/2015 11/10/14   Tammy L. Triplett, PA-C  traMADol (ULTRAM) 50 MG tablet Take 1 tablet (50 mg total) by mouth every 6 (six) hours as needed. Patient not taking: Reported on 01/16/2015 09/27/14   Kathie Dike, PA-C   BP  138/82 mmHg  Pulse 82  Temp(Src) 98.1 F (36.7 C) (Oral)  Resp 18  Ht 5\' 6"  (1.676 m)  Wt 190 lb (86.183 kg)  BMI 30.68 kg/m2  SpO2 96% Physical Exam  Constitutional: He is oriented to person, place, and time. He appears well-developed and well-nourished.  HENT:  Head: Normocephalic and atraumatic.  multiple dental carries to the gumline lower left more than right Swelling of the gum no visible abscess Airway is patent No swelling under the tongue   Eyes: Conjunctivae are normal.  Neck: Normal range of motion. Neck  supple.  Pulmonary/Chest: Effort normal.  Musculoskeletal: Normal range of motion.  Neurological: He is alert and oriented to person, place, and time.  Skin: Skin is warm and dry.  Psychiatric: He has a normal mood and affect.  Nursing note and vitals reviewed.   ED Course  Procedures (including critical care time)  DIAGNOSTIC STUDIES: Oxygen Saturation is 96% on room air, adequate by my interpretation.    COORDINATION OF CARE: 11:48 AM Discussed treatment plan with patient at beside, the patient agrees with the plan and has no further questions at this time.   Labs Review Labs Reviewed - No data to display  Imaging Review No results found.   EKG Interpretation None      MDM  Dental caries present. Vital signs stble. No evidence for Ludwig's angina. Rx for tylenol #3 and amoxil given.   Final diagnoses:  None    *I have reviewed nursing notes, vital signs, and all appropriate lab and imaging results for this patient.**  **I personally performed the services described in this documentation, which was scribed in my presence. The recorded information has been reviewed and is accurate.Kathie Dike*   Graceann Boileau M Arbadella Kimbler, PA-C 01/22/15 1946  Vida RollerBrian D Miller, MD 01/23/15 2046

## 2015-01-16 NOTE — Discharge Instructions (Signed)
Dental Caries °Dental caries is tooth decay. This decay can cause a hole in teeth (cavity) that can get bigger and deeper over time. °HOME CARE °· Brush and floss your teeth. Do this at least two times a day. °· Use a fluoride toothpaste. °· Use a mouth rinse if told by your dentist or doctor. °· Eat less sugary and starchy foods. Drink less sugary drinks. °· Avoid snacking often on sugary and starchy foods. Avoid sipping often on sugary drinks. °· Keep regular checkups and cleanings with your dentist. °· Use fluoride supplements if told by your dentist or doctor. °· Allow fluoride to be applied to teeth if told by your dentist or doctor. °Document Released: 08/20/2008 Document Revised: 03/28/2014 Document Reviewed: 11/13/2012 °ExitCare® Patient Information ©2015 [employer]. This information is not intended to replace advice given to you by your health care provider. Make sure you discuss any questions you have with your health care provider. ° °

## 2015-01-16 NOTE — ED Notes (Signed)
Lt lower dental pain for 4-5 days, says he has an appt to have these teeth removed in March.  Alert, NAD

## 2015-01-16 NOTE — ED Notes (Signed)
Pt states having dental pain on left side.

## 2015-06-17 ENCOUNTER — Encounter (HOSPITAL_COMMUNITY): Payer: Self-pay | Admitting: Cardiology

## 2015-06-17 ENCOUNTER — Emergency Department (HOSPITAL_COMMUNITY)
Admission: EM | Admit: 2015-06-17 | Discharge: 2015-06-17 | Disposition: A | Discharge status: Home / Self Care | Payer: Self-pay | Attending: Emergency Medicine | Admitting: Emergency Medicine

## 2015-06-17 DIAGNOSIS — K002 Abnormalities of size and form of teeth: Secondary | ICD-10-CM | POA: Insufficient documentation

## 2015-06-17 DIAGNOSIS — Z720 Tobacco use: Secondary | ICD-10-CM | POA: Insufficient documentation

## 2015-06-17 DIAGNOSIS — K029 Dental caries, unspecified: Secondary | ICD-10-CM | POA: Insufficient documentation

## 2015-06-17 DIAGNOSIS — K0381 Cracked tooth: Secondary | ICD-10-CM | POA: Insufficient documentation

## 2015-06-17 DIAGNOSIS — Z79899 Other long term (current) drug therapy: Secondary | ICD-10-CM | POA: Insufficient documentation

## 2015-06-17 DIAGNOSIS — G8929 Other chronic pain: Secondary | ICD-10-CM | POA: Insufficient documentation

## 2015-06-17 MED ORDER — ACETAMINOPHEN-CODEINE #3 300-30 MG PO TABS: 1.0000 | ORAL_TABLET | ORAL | Status: DC | PRN

## 2015-06-17 MED ORDER — KETOROLAC TROMETHAMINE 30 MG/ML IJ SOLN
30.0000 mg | Freq: Once | INTRAMUSCULAR | Status: DC
  Filled 2015-06-17: qty 1

## 2015-06-17 NOTE — Discharge Instructions (Signed)
Dental Care and Dentist Visits  Dental care supports good overall health. Regular dental visits can also help you avoid dental pain, bleeding, infection, and other more serious health problems in the future. It is important to keep the mouth healthy because diseases in the teeth, gums, and other oral tissues can spread to other areas of the body. Some problems, such as diabetes, heart disease, and pre-term labor have been associated with poor oral health.   See your dentist every 6 months. If you experience emergency problems such as a toothache or broken tooth, go to the dentist right away. If you see your dentist regularly, you may catch problems early. It is easier to be treated for problems in the early stages.   WHAT TO EXPECT AT A DENTIST VISIT   Your dentist will look for many common oral health problems and recommend proper treatment. At your regular dental visit, you can expect:  · Gentle cleaning of the teeth and gums. This includes scraping and polishing. This helps to remove the sticky substance around the teeth and gums (plaque). Plaque forms in the mouth shortly after eating. Over time, plaque hardens on the teeth as tartar. If tartar is not removed regularly, it can cause problems. Cleaning also helps remove stains.  · Periodic X-rays. These pictures of the teeth and supporting bone will help your dentist assess the health of your teeth.  · Periodic fluoride treatments. Fluoride is a natural mineral shown to help strengthen teeth. Fluoride treatment involves applying a fluoride gel or varnish to the teeth. It is most commonly done in children.  · Examination of the mouth, tongue, jaws, teeth, and gums to look for any oral health problems, such as:  ¨ Cavities (dental caries). This is decay on the tooth caused by plaque, sugar, and acid in the mouth. It is best to catch a cavity when it is small.  ¨ Inflammation of the gums caused by plaque buildup (gingivitis).  ¨ Problems with the mouth or malformed  or misaligned teeth.  ¨ Oral cancer or other diseases of the soft tissues or jaws.   KEEP YOUR TEETH AND GUMS HEALTHY  For healthy teeth and gums, follow these general guidelines as well as your dentist's specific advice:  · Have your teeth professionally cleaned at the dentist every 6 months.  · Brush twice daily with a fluoride toothpaste.  · Floss your teeth daily.   · Ask your dentist if you need fluoride supplements, treatments, or fluoride toothpaste.  · Eat a healthy diet. Reduce foods and drinks with added sugar.  · Avoid smoking.  TREATMENT FOR ORAL HEALTH PROBLEMS  If you have oral health problems, treatment varies depending on the conditions present in your teeth and gums.  · Your caregiver will most likely recommend good oral hygiene at each visit.  · For cavities, gingivitis, or other oral health disease, your caregiver will perform a procedure to treat the problem. This is typically done at a separate appointment. Sometimes your caregiver will refer you to another dental specialist for specific tooth problems or for surgery.  SEEK IMMEDIATE DENTAL CARE IF:  · You have pain, bleeding, or soreness in the gum, tooth, jaw, or mouth area.  · A permanent tooth becomes loose or separated from the gum socket.  · You experience a blow or injury to the mouth or jaw area.  Document Released: 07/24/2011 Document Revised: 02/03/2012 Document Reviewed: 07/24/2011  ExitCare® Patient Information ©2015 [employer]. This information is not intended to replace advice   given to you by your health care provider. Make sure you discuss any questions you have with your health care provider.

## 2015-06-17 NOTE — ED Notes (Signed)
Dental pain and swelling  times 3 days.

## 2015-06-17 NOTE — ED Provider Notes (Signed)
CSN: [id number]     Arrival date & time 06/17/15  4098 History   First MD Initiated Contact with Patient 06/17/15 [phone]     Chief Complaint  Patient presents with  . Dental Pain     (Consider location/radiation/quality/duration/timing/severity/associated sxs/prior Treatment) Patient is a 34 y.o. male presenting with tooth pain. The history is provided by the patient. No language interpreter was used.  Dental Pain Associated symptoms: no facial swelling and no fever   Mr. Rietz is a 34 year old male with a history of chronic dental pain who presents for left lower dental pain for the past 3 days. Nothing makes it better or worse. He denies taking anything for pain. He has been seen multiple times in the ED for dental pain. He states he has an appointment on August 25,2016 to have his teeth removed. He denies any fever, chills, facial swelling, drooling, shortness of breath, drainage from the gums. Patient requesting oxycodone for pain. Patient isn't every day smoker.  Past Medical History  Diagnosis Date  . Chronic dental pain    Past Surgical History  Procedure Laterality Date  . Appendectomy     History reviewed. No pertinent family history. History  Substance Use Topics  . Smoking status: Current Every Day Smoker -- 0.50 packs/day for 1.5 years    Types: Cigarettes  . Smokeless tobacco: Never Used  . Alcohol Use: No    Review of Systems  Constitutional: Negative for fever and chills.  HENT: Positive for dental problem. Negative for facial swelling.   Respiratory: Negative for shortness of breath.   All other systems reviewed and are negative.     Allergies  Tramadol  Home Medications   Prior to Admission medications   Medication Sig Start Date End Date Taking? Authorizing Provider  ibuprofen (ADVIL,MOTRIN) 200 MG tablet Take 400 mg by mouth every 6 (six) hours as needed for moderate pain.   Yes Historical Provider, MD  phenytoin (DILANTIN) 100 MG ER capsule Take  100 mg by mouth 2 (two) times daily.   Yes Historical Provider, MD  acetaminophen-codeine (TYLENOL #3) 300-30 MG per tablet Take 1 tablet by mouth every 4 (four) hours as needed for moderate pain. 06/17/15   Nachman Sundt Patel-Mills, PA-C  amoxicillin (AMOXIL) 500 MG capsule Take 1 capsule (500 mg total) by mouth 3 (three) times daily. Patient not taking: Reported on 06/17/2015 01/16/15   Ivery Quale, PA-C  ibuprofen (ADVIL,MOTRIN) 800 MG tablet Take 1 tablet (800 mg total) by mouth 3 (three) times daily. Patient not taking: Reported on 06/17/2015 01/16/15   Ivery Quale, PA-C   BP 128/86 mmHg  Pulse 80  Temp(Src) 98 F (36.7 C) (Oral)  Resp 18  Ht  (1.676 m)  Wt 190 lb (86.183 kg)  BMI 30.68 kg/m2  SpO2 100% Physical Exam  Constitutional: He is oriented to person, place, and time. He appears well-developed and well-nourished.  HENT:  Head: Normocephalic and atraumatic.  Mouth/Throat: Uvula is midline, oropharynx is clear and moist and mucous membranes are normal. No trismus in the jaw. Abnormal dentition. Dental caries present. No dental abscesses.  He has multiple dental fractures that are chronic. Multiple dental caries. No palpable fluctuance or abscess noted to the teeth or gums. No facial swelling. No anterior cervical lymphadenopathy or neck swelling.  Eyes: Conjunctivae are normal.  Neck: Normal range of motion. Neck supple.  Cardiovascular: Normal rate, regular rhythm and normal heart sounds.   Pulmonary/Chest: Effort normal and breath sounds normal.  Abdominal: Soft.  There is no tenderness.  Musculoskeletal: Normal range of motion.  Neurological: He is alert and oriented to person, place, and time.  Skin: Skin is warm and dry.  Psychiatric: He has a normal mood and affect. His behavior is normal.  Nursing note and vitals reviewed.   ED Course  Procedures (including critical care time) Labs Review Labs Reviewed - No data to display  Imaging Review No results found.    EKG Interpretation None      MDM   Final diagnoses:  Dental caries  Patient presents for dental pain and has been seen in the ED multiple times for the same. He is requesting oxycodone. Patient states he has a dentist already and that he is having some teeth removed on August 25. He has no signs of dental infection or Ludwigs angina. He has no facial or neck swelling. He was given a prescription for Tylenol #3 and discussed that I would not give him oxycodone for pain. I also discussed that he should try to see his dentist sooner if he had chronic pain. Patient verbally agreed with the plan. Medications  ketorolac (TORADOL) 30 MG/ML injection 30 mg (30 mg Intramuscular Not Given 06/17/15 1005)       Catha Gosselin, PA-C 06/17/15 1137  Bethann Berkshire, MD 06/17/15 1351

## 2015-07-06 ENCOUNTER — Emergency Department (HOSPITAL_COMMUNITY)
Admission: EM | Admit: 2015-07-06 | Discharge: 2015-07-06 | Disposition: A | Discharge status: Home / Self Care | Payer: Self-pay | Attending: Emergency Medicine | Admitting: Emergency Medicine

## 2015-07-06 ENCOUNTER — Encounter (HOSPITAL_COMMUNITY): Payer: Self-pay | Admitting: Emergency Medicine

## 2015-07-06 DIAGNOSIS — Z7289 Other problems related to lifestyle: Secondary | ICD-10-CM | POA: Insufficient documentation

## 2015-07-06 DIAGNOSIS — Z79899 Other long term (current) drug therapy: Secondary | ICD-10-CM | POA: Insufficient documentation

## 2015-07-06 DIAGNOSIS — Z765 Malingerer [conscious simulation]: Secondary | ICD-10-CM

## 2015-07-06 DIAGNOSIS — G8929 Other chronic pain: Secondary | ICD-10-CM | POA: Insufficient documentation

## 2015-07-06 DIAGNOSIS — Z720 Tobacco use: Secondary | ICD-10-CM | POA: Insufficient documentation

## 2015-07-06 DIAGNOSIS — K088 Other specified disorders of teeth and supporting structures: Secondary | ICD-10-CM | POA: Insufficient documentation

## 2015-07-06 DIAGNOSIS — K089 Disorder of teeth and supporting structures, unspecified: Secondary | ICD-10-CM

## 2015-07-06 NOTE — ED Notes (Signed)
Patient left prior to receiving discharge instructions. 

## 2015-07-06 NOTE — ED Notes (Signed)
Patient complaining of abscessed tooth to left lower. States started hurting him two days ago.

## 2015-07-06 NOTE — ED Provider Notes (Signed)
CSN: [id number]     Arrival date & time 07/06/15  4098 History   First MD Initiated Contact with Patient 07/06/15 [phone]     Chief Complaint  Patient presents with  . Dental Problem     (Consider location/radiation/quality/duration/timing/severity/associated sxs/prior Treatment) The history is provided by the patient.   34 year old male comes in complaining of pain in the left lower tooth that he is afraid his abscess. He states pain is severe and he rates it at 10/10. Pain is worse with exposure to hot and cold. He has tried taking acetaminophen and ibuprofen without any benefit. He states that he had one Tylenol No. 3 left from a prior prescription and they did not give him any relief from pain.  Past Medical History  Diagnosis Date  . Chronic dental pain    Past Surgical History  Procedure Laterality Date  . Appendectomy     History reviewed. No pertinent family history. Social History  Substance Use Topics  . Smoking status: Current Every Day Smoker -- 0.50 packs/day for 1.5 years    Types: Cigarettes  . Smokeless tobacco: Never Used  . Alcohol Use: No    Review of Systems  All other systems reviewed and are negative.     Allergies  Tramadol  Home Medications   Prior to Admission medications   Medication Sig Start Date End Date Taking? Authorizing Provider  acetaminophen-codeine (TYLENOL #3) 300-30 MG per tablet Take 1 tablet by mouth every 4 (four) hours as needed for moderate pain. 06/17/15   Hanna Patel-Mills, PA-C  amoxicillin (AMOXIL) 500 MG capsule Take 1 capsule (500 mg total) by mouth 3 (three) times daily. Patient not taking: Reported on 06/17/2015 01/16/15   Ivery Quale, PA-C  ibuprofen (ADVIL,MOTRIN) 200 MG tablet Take 400 mg by mouth every 6 (six) hours as needed for moderate pain.    Historical Provider, MD  ibuprofen (ADVIL,MOTRIN) 800 MG tablet Take 1 tablet (800 mg total) by mouth 3 (three) times daily. Patient not taking: Reported on 06/17/2015 01/16/15    Ivery Quale, PA-C  phenytoin (DILANTIN) 100 MG ER capsule Take 100 mg by mouth 2 (two) times daily.    Historical Provider, MD   BP 116/65 mmHg  Pulse 70  Temp(Src) 97.9 F (36.6 C) (Oral)  Resp 20  Ht  (1.676 m)  Wt 199 lb (90.266 kg)  BMI 32.13 kg/m2  SpO2 98% Physical Exam  Nursing note and vitals reviewed.  34 year old male, resting comfortably and in no acute distress. Vital signs are normal. Oxygen saturation is 98%, which is normal. Head is normocephalic and atraumatic. PERRLA, EOMI. Oropharynx is clear. Overall dentition is in extremely poor condition. I do not see any obvious areas of abscess although there is minimal gingival swelling over the left lower molars. No dominant lesion is seen. He is tender rather diffusely. No facial swelling is seen. Neck is nontender and supple without adenopathy or JVD. Back is nontender and there is no CVA tenderness. Lungs are clear without rales, wheezes, or rhonchi. Chest is nontender. Heart has regular rate and rhythm without murmur. Abdomen is soft, flat, nontender without masses or hepatosplenomegaly and peristalsis is normoactive. Extremities have no cyanosis or edema, full range of motion is present. Skin is warm and dry without rash. Neurologic: Mental status is normal, cranial nerves are intact, there are no motor or sensory deficits.  ED Course  Procedures (including critical care time)  MDM   Final diagnoses:  Chronic dental pain  Drug-seeking behavior    Exacerbation of chronic dental pain. I reviewed his past records and he has 27 prior ED visits for dental pain. Last prior visit was on July 23 at which time he stated he had an appointment to have his teeth extracted on August 25. I mentioned this to the patient he stated that he had to cancel an appointment because he could not meet the co-pay. I asked when his last narcotic prescription had been and he states that it was over a month ago. I reviewed his record  on the [place] controlled substance reporting website and he had prescriptions for oxycodone have acetaminophen filled on August 6 and acetaminophen with codeine No. 3 filled on August 5. I confronted the patient with this and told him that he would not get any narcotic prescriptions from me that he would have to use acetaminophen and ibuprofen for pain control. I told him I would give him a prescription for an anabolic. As I was preparing to get the prescriptions for him, he came up to me and stated that he had an anabolic prescription at a pharmacy that he had not been able to get filled because of cost issues and that he would go to the pharmacy to get that. I informed him that I would prescribe him something that was an expensive to be sure he had an anabolic. However, the patient left before getting a prescription for discharge instructions. Clearly, he came to the ED with the intent of getting narcotic prescriptions and left as soon as it became evident that he was not going to get one.    Dione Booze, MD 07/06/15 [phone]

## 2015-07-20 ENCOUNTER — Emergency Department (HOSPITAL_COMMUNITY)
Admission: EM | Admit: 2015-07-20 | Discharge: 2015-07-20 | Disposition: A | Discharge status: Home / Self Care | Payer: Self-pay | Attending: Emergency Medicine | Admitting: Emergency Medicine

## 2015-07-20 ENCOUNTER — Encounter (HOSPITAL_COMMUNITY): Payer: Self-pay

## 2015-07-20 ENCOUNTER — Emergency Department (HOSPITAL_COMMUNITY): Payer: Self-pay

## 2015-07-20 DIAGNOSIS — J209 Acute bronchitis, unspecified: Secondary | ICD-10-CM | POA: Insufficient documentation

## 2015-07-20 DIAGNOSIS — J40 Bronchitis, not specified as acute or chronic: Secondary | ICD-10-CM

## 2015-07-20 DIAGNOSIS — Z79899 Other long term (current) drug therapy: Secondary | ICD-10-CM | POA: Insufficient documentation

## 2015-07-20 DIAGNOSIS — G8929 Other chronic pain: Secondary | ICD-10-CM | POA: Insufficient documentation

## 2015-07-20 DIAGNOSIS — Z720 Tobacco use: Secondary | ICD-10-CM | POA: Insufficient documentation

## 2015-07-20 LAB — BASIC METABOLIC PANEL
Anion gap: 6 (ref 5–15)
BUN: 13 mg/dL (ref 6–20)
CALCIUM: 9 mg/dL (ref 8.9–10.3)
CO2: 26 mmol/L (ref 22–32)
CREATININE: 0.9 mg/dL (ref 0.61–1.24)
Chloride: 104 mmol/L (ref 101–111)
GFR calc Af Amer: >60 mL/min (ref >60)
GFR calc non Af Amer: >60 mL/min (ref >60)
Glucose, Bld: 129 mg/dL — ABNORMAL HIGH (ref 65–99)
Potassium: 4.2 mmol/L (ref 3.5–5.1)
Sodium: 136 mmol/L (ref 135–145)

## 2015-07-20 LAB — CBC WITH DIFFERENTIAL/PLATELET
BASOS PCT: 1 % (ref 0–1)
Basophils Absolute: 0.1 10*3/uL (ref 0.0–0.1)
Eosinophils Absolute: 0.2 10*3/uL (ref 0.0–0.7)
Eosinophils Relative: 1 % (ref 0–5)
HCT: 39.1 % (ref 39.0–52.0)
Hemoglobin: 12.7 g/dL — ABNORMAL LOW (ref 13.0–17.0)
LYMPHS ABS: 2 10*3/uL (ref 0.7–4.0)
Lymphocytes Relative: 16 % (ref 12–46)
MCH: 20.3 pg — AB (ref 26.0–34.0)
MCHC: 32.5 g/dL (ref 30.0–36.0)
MCV: 62.4 fL — ABNORMAL LOW (ref 78.0–100.0)
MONO ABS: 0.8 10*3/uL (ref 0.1–1.0)
MONOS PCT: 6 % (ref 3–12)
NEUTROS ABS: 9.7 10*3/uL — AB (ref 1.7–7.7)
Neutrophils Relative %: 77 % (ref 43–77)
Platelets: 392 10*3/uL (ref 150–400)
RBC: 6.27 MIL/uL — ABNORMAL HIGH (ref 4.22–5.81)
RDW: 15.9 % — AB (ref 11.5–15.5)
WBC: 12.6 10*3/uL — ABNORMAL HIGH (ref 4.0–10.5)

## 2015-07-20 MED ORDER — AMOXICILLIN 500 MG PO CAPS: 500.0000 mg | ORAL_CAPSULE | Freq: Three times a day (TID) | ORAL | Status: DC

## 2015-07-20 MED ORDER — HYDROCODONE-ACETAMINOPHEN 5-325 MG PO TABS: 1.0000 | ORAL_TABLET | Freq: Four times a day (QID) | ORAL | Status: DC | PRN

## 2015-07-20 MED ORDER — HYDROCODONE-ACETAMINOPHEN 5-325 MG PO TABS
1.0000 | ORAL_TABLET | Freq: Once | ORAL | Status: AC
  Administered 2015-07-20: 1 via ORAL
  Filled 2015-07-20: qty 1

## 2015-07-20 NOTE — ED Provider Notes (Signed)
CSN: [id number]     Arrival date & time 07/20/15  1651 History   First MD Initiated Contact with Patient 07/20/15 1658     Chief Complaint  Patient presents with  . Hemoptysis     (Consider location/radiation/quality/duration/timing/severity/associated sxs/prior Treatment) Patient is a 34 y.o. male presenting with cough. The history is provided by the patient (pt complains of a cough for a week. now coughing up some blood).  Cough Cough characteristics:  Productive Sputum characteristics:  Green Severity:  Mild Onset quality:  Sudden Timing:  Constant Progression:  Waxing and waning Chronicity:  New Associated symptoms: no chest pain, no eye discharge, no headaches and no rash     Past Medical History  Diagnosis Date  . Chronic dental pain    Past Surgical History  Procedure Laterality Date  . Appendectomy     History reviewed. No pertinent family history. Social History  Substance Use Topics  . Smoking status: Current Every Day Smoker -- 0.50 packs/day for 1.5 years    Types: Cigarettes  . Smokeless tobacco: Never Used  . Alcohol Use: No    Review of Systems  Constitutional: Negative for appetite change and fatigue.  HENT: Negative for congestion, ear discharge and sinus pressure.   Eyes: Negative for discharge.  Respiratory: Positive for cough.   Cardiovascular: Negative for chest pain.  Gastrointestinal: Negative for abdominal pain and diarrhea.  Genitourinary: Negative for frequency and hematuria.  Musculoskeletal: Negative for back pain.  Skin: Negative for rash.  Neurological: Negative for seizures and headaches.  Psychiatric/Behavioral: Negative for hallucinations.      Allergies  Tramadol  Home Medications   Prior to Admission medications   Medication Sig Start Date End Date Taking? Authorizing Provider  FLUoxetine (PROZAC) 20 MG capsule Take 20 mg by mouth daily.   Yes Historical Provider, MD  ibuprofen (ADVIL,MOTRIN) 200 MG tablet Take 400 mg  by mouth every 6 (six) hours as needed for moderate pain.   Yes Historical Provider, MD  phenytoin (DILANTIN) 100 MG ER capsule Take 100 mg by mouth 2 (two) times daily.   Yes Historical Provider, MD  acetaminophen-codeine (TYLENOL #3) 300-30 MG per tablet Take 1 tablet by mouth every 4 (four) hours as needed for moderate pain. Patient not taking: Reported on 07/20/2015 06/17/15   Catha Gosselin, PA-C  amoxicillin (AMOXIL) 500 MG capsule Take 1 capsule (500 mg total) by mouth 3 (three) times daily. 07/20/15   Bethann Berkshire, MD  HYDROcodone-acetaminophen (NORCO/VICODIN) 5-325 MG per tablet Take 1 tablet by mouth every 6 (six) hours as needed. 07/20/15   Bethann Berkshire, MD  ibuprofen (ADVIL,MOTRIN) 800 MG tablet Take 1 tablet (800 mg total) by mouth 3 (three) times daily. Patient not taking: Reported on 06/17/2015 01/16/15   Ivery Quale, PA-C   BP 113/57 mmHg  Pulse 72  Temp(Src) 97.7 F (36.5 C) (Oral)  Resp 20  Ht  (1.676 m)  Wt 199 lb (90.266 kg)  BMI 32.13 kg/m2  SpO2 97% Physical Exam  Constitutional: He is oriented to person, place, and time. He appears well-developed.  HENT:  Head: Normocephalic.  Eyes: Conjunctivae and EOM are normal. No scleral icterus.  Neck: Neck supple. No thyromegaly present.  Cardiovascular: Normal rate and regular rhythm.  Exam reveals no gallop and no friction rub.   No murmur heard. Pulmonary/Chest: No stridor. He has no wheezes. He has no rales. He exhibits no tenderness.  Abdominal: He exhibits no distension. There is no tenderness. There is no rebound.  Musculoskeletal: Normal range of motion. He exhibits no edema.  Lymphadenopathy:    He has no cervical adenopathy.  Neurological: He is oriented to person, place, and time. He exhibits normal muscle tone. Coordination normal.  Skin: No rash noted. No erythema.  Psychiatric: He has a normal mood and affect. His behavior is normal.    ED Course  Procedures (including critical care time) Labs  Review Labs Reviewed  CBC WITH DIFFERENTIAL/PLATELET - Abnormal; Notable for the following:    WBC 12.6 (*)    RBC 6.27 (*)    Hemoglobin 12.7 (*)    MCV 62.4 (*)    MCH 20.3 (*)    RDW 15.9 (*)    Neutro Abs 9.7 (*)    All other components within normal limits  BASIC METABOLIC PANEL - Abnormal; Notable for the following:    Glucose, Bld 129 (*)    All other components within normal limits    Imaging Review Dg Chest 2 View  07/20/2015   CLINICAL DATA:  Productive cough for 2 weeks  EXAM: CHEST  2 VIEW  COMPARISON:  July 14, 2015  FINDINGS: The heart size and mediastinal contours are within normal limits. Both lungs are clear. The visualized skeletal structures are unremarkable.  IMPRESSION: No active cardiopulmonary disease.   Electronically Signed   By: Sherian Rein M.D.   On: 07/20/2015 18:09   I have personally reviewed and evaluated these images and lab results as part of my medical decision-making.   EKG Interpretation None      MDM   Final diagnoses:  Bronchitis    Pt with productive cough now coughing up some streaks of blood,   Labs and x-ray unremarkable.  Suspect bronchitis,  Doubt pe,  rx with amox, vicodin and follow up next week.    Bethann Berkshire, MD 07/20/15 [phone]

## 2015-07-20 NOTE — Discharge Instructions (Signed)
Follow up next week if not improving. °

## 2015-07-20 NOTE — ED Notes (Signed)
Pt c/o coughing up blood, has had cough for the past week.

## 2016-08-08 ENCOUNTER — Encounter (HOSPITAL_COMMUNITY): Payer: Self-pay

## 2016-08-08 ENCOUNTER — Emergency Department (HOSPITAL_COMMUNITY)
Admission: EM | Admit: 2016-08-08 | Discharge: 2016-08-08 | Disposition: A | Discharge status: Home / Self Care | Payer: Self-pay | Attending: Emergency Medicine | Admitting: Emergency Medicine

## 2016-08-08 ENCOUNTER — Emergency Department (HOSPITAL_COMMUNITY): Payer: Self-pay

## 2016-08-08 DIAGNOSIS — F17210 Nicotine dependence, cigarettes, uncomplicated: Secondary | ICD-10-CM | POA: Insufficient documentation

## 2016-08-08 DIAGNOSIS — Z791 Long term (current) use of non-steroidal anti-inflammatories (NSAID): Secondary | ICD-10-CM | POA: Insufficient documentation

## 2016-08-08 DIAGNOSIS — Z79899 Other long term (current) drug therapy: Secondary | ICD-10-CM | POA: Insufficient documentation

## 2016-08-08 DIAGNOSIS — M542 Cervicalgia: Secondary | ICD-10-CM | POA: Insufficient documentation

## 2016-08-08 MED ORDER — ONDANSETRON HCL 4 MG PO TABS
4.0000 mg | ORAL_TABLET | Freq: Once | ORAL | Status: DC
  Filled 2016-08-08: qty 1

## 2016-08-08 MED ORDER — CYCLOBENZAPRINE HCL 10 MG PO TABS
10.0000 mg | ORAL_TABLET | Freq: Once | ORAL | Status: DC
  Filled 2016-08-08: qty 1

## 2016-08-08 MED ORDER — INDOMETHACIN 25 MG PO CAPS
25.0000 mg | ORAL_CAPSULE | Freq: Once | ORAL | Status: DC
  Filled 2016-08-08: qty 1

## 2016-08-08 MED ORDER — PREDNISONE 50 MG PO TABS
60.0000 mg | ORAL_TABLET | Freq: Once | ORAL | Status: DC
  Filled 2016-08-08: qty 1

## 2016-08-08 NOTE — ED Triage Notes (Signed)
Pt reports neck pain x 6 months.  Denies injury.  Reports has been going to the health dept and they prescribed neurontin but it isn't helping.

## 2016-08-08 NOTE — ED Notes (Signed)
RN to room to administer medications. Pt not present. Pt not in waiting area or in bathroom. PA informed of pt's elopement.

## 2016-08-08 NOTE — ED Provider Notes (Signed)
[facility]-EMERGENCY DEPT Provider Note   CSN: [id number] Arrival date & time: 08/08/16  1402     History   Chief Complaint Chief Complaint  Patient presents with  . Neck Pain    HPI Johnathan Welch is a 35 y.o. male.  Patient is a 35 year old male who presents to the emergency department complaining of on neck pain.  The patient states that for the past 6 months he's been having pain in the back of his neck. He does not recall any known injury. He states he is seen primarily at the health department for his healthcare. They placed him on Neurontin, but states this is not really helping him very much. He denies difficulty using his upper extremities. He denies difficulty with his grip. He denies any unusual numbness or tingling of his upper extremities. He denies any difficulty with his walking or numbness or tingling of his lower extremities.      Past Medical History:  Diagnosis Date  . Chronic dental pain     There are no active problems to display for this patient.   Past Surgical History:  Procedure Laterality Date  . APPENDECTOMY         Home Medications    Prior to Admission medications   Medication Sig Start Date End Date Taking? Authorizing Provider  albuterol (PROVENTIL HFA;VENTOLIN HFA) 108 (90 Base) MCG/ACT inhaler Inhale 2 puffs into the lungs every 6 (six) hours as needed for wheezing or shortness of breath.   Yes Historical Provider, MD  FLUoxetine (PROZAC) 20 MG capsule Take 20 mg by mouth daily.   Yes Historical Provider, MD  gabapentin (NEURONTIN) 300 MG capsule Take 300 mg by mouth 2 (two) times daily.   Yes Historical Provider, MD  ibuprofen (ADVIL,MOTRIN) 200 MG tablet Take 400 mg by mouth every 6 (six) hours as needed for moderate pain.   Yes Historical Provider, MD  phenytoin (DILANTIN) 100 MG ER capsule Take 100 mg by mouth 2 (two) times daily.   Yes Historical Provider, MD    Family History No family history on file.  Social  History Social History  Substance Use Topics  . Smoking status: Current Every Day Smoker    Packs/day: 0.50    Years: 1.50    Types: Cigarettes  . Smokeless tobacco: Never Used  . Alcohol use No     Allergies   Tramadol   Review of Systems Review of Systems  Musculoskeletal: Positive for neck pain.  All other systems reviewed and are negative.    Physical Exam Updated Vital Signs BP 128/71 (BP Location: Left Arm)   Pulse 64   Temp 98.7 F (37.1 C) (Oral)   Resp 18   Ht 5\' 6"  (1.676 m)   Wt 83.5 kg   SpO2 99%   BMI 29.70 kg/m   Physical Exam  Constitutional: Vital signs are normal. He appears well-developed and well-nourished. He is active.  HENT:  Head: Normocephalic and atraumatic.  Right Ear: Tympanic membrane, external ear and ear canal normal.  Left Ear: Tympanic membrane, external ear and ear canal normal.  Nose: Nose normal.  Mouth/Throat: Uvula is midline, oropharynx is clear and moist and mucous membranes are normal.  Eyes: Conjunctivae, EOM and lids are normal. Pupils are equal, round, and reactive to light.  Neck: Trachea normal, normal range of motion and phonation normal. Neck supple. Carotid bruit is not present.  Cardiovascular: Normal rate, regular rhythm and normal pulses.   Abdominal: Soft. Normal appearance and bowel  sounds are normal.  Musculoskeletal: Normal range of motion. He exhibits tenderness.  There is pain to palpation of the cervical spine and the paraspinal muscles in the cervical area. There no hot areas noted of the cervical spine. There is no step off of the cervical spine, thoracic spine, or lumbar spine.  Lymphadenopathy:       Head (right side): No submental, no preauricular and no posterior auricular adenopathy present.       Head (left side): No submental, no preauricular and no posterior auricular adenopathy present.    He has no cervical adenopathy.  Neurological: He is alert. He has normal strength. No cranial nerve deficit  or sensory deficit. GCS eye subscore is 4. GCS verbal subscore is 5. GCS motor subscore is 6.  There no motor or sensory deficits of the upper or lower extremity. Gait is steady and intact.  Skin: Skin is warm and dry.  Psychiatric: His speech is normal.     ED Treatments / Results  Labs (all labs ordered are listed, but only abnormal results are displayed) Labs Reviewed - No data to display  EKG  EKG Interpretation None       Radiology No results found.  Procedures Procedures (including critical care time)  Medications Ordered in ED Medications - No data to display   Initial Impression / Assessment and Plan / ED Course  I have reviewed the triage vital signs and the nursing notes.  Pertinent labs & imaging results that were available during my care of the patient were reviewed by me and considered in my medical decision making (see chart for details).  Clinical Course    *I have reviewed nursing notes, vital signs, and all appropriate lab and imaging results for this patient.**  Final Clinical Impressions(s) / ED Diagnoses  Your vital signs are within normal limits. There no neurologic deficits appreciated on examination. The x-ray of your cervical spine is negative for acute finding. Please see Dr. Romeo AppleHarrison for orthopedic evaluation of this pain issue. Heating pad to your back maybe helpful. Please use Decadron and diclofenac 2 times daily. Please use baclofen 3 times daily for spasm pain. Baclofen may cause drowsiness, please use this medication with caution.   At discharge, the patient had  left the room without his d/c papers or letting anyone know he was leaving.   Final diagnoses:  None    New Prescriptions New Prescriptions   No medications on file     Ivery QualeHobson Kree Armato, PA-C 08/08/16 1553    Ivery QualeHobson Eknoor Novack, PA-C 08/10/16 1435    Raeford RazorStephen Kohut, MD 08/11/16 [phone]

## 2016-08-26 ENCOUNTER — Emergency Department (HOSPITAL_COMMUNITY)
Admission: EM | Admit: 2016-08-26 | Discharge: 2016-08-26 | Disposition: A | Discharge status: Home / Self Care | Payer: Self-pay | Attending: Emergency Medicine | Admitting: Emergency Medicine

## 2016-08-26 ENCOUNTER — Encounter (HOSPITAL_COMMUNITY): Payer: Self-pay | Admitting: Emergency Medicine

## 2016-08-26 DIAGNOSIS — F17210 Nicotine dependence, cigarettes, uncomplicated: Secondary | ICD-10-CM | POA: Insufficient documentation

## 2016-08-26 DIAGNOSIS — K029 Dental caries, unspecified: Secondary | ICD-10-CM | POA: Insufficient documentation

## 2016-08-26 MED ORDER — NAPROXEN 500 MG PO TABS: 500.0000 mg | ORAL_TABLET | Freq: Two times a day (BID) | ORAL | 0 refills | Status: DC

## 2016-08-26 MED ORDER — PENICILLIN V POTASSIUM 500 MG PO TABS: 500.0000 mg | ORAL_TABLET | Freq: Three times a day (TID) | ORAL | 0 refills | Status: AC

## 2016-08-26 MED ORDER — HYDROCODONE-ACETAMINOPHEN 5-325 MG PO TABS: 1.0000 | ORAL_TABLET | ORAL | 0 refills | Status: DC | PRN

## 2016-08-26 NOTE — Discharge Instructions (Signed)
Take antibiotics as prescribed. Follow up with Dr. [name] as scheduled.

## 2016-08-26 NOTE — ED Triage Notes (Signed)
Pt states for the last three days has been having tooth pain on lower molar area pt has very poor dental health and states he has been having problems with pain. Pt made dental appointment for 10/28 at ED for pain control.

## 2016-08-26 NOTE — ED Provider Notes (Signed)
[facility]-EMERGENCY DEPT Provider Note   CSN: [id number] Arrival date & time: 08/26/16  1305    History   Chief Complaint Chief Complaint  Patient presents with  . Dental Pain   HPI   Johnathan Welch is an 35 y.o. male who presents to the ED for evaluation of dental pain. He states he needs full mouth extractions and has an appointment on 10/26 with Dr. [name]. He states that was the earliest appointment he could get. States he is here because over the past few days he has developed lower right quadrant pain. He tried calling Dr. [name] again who cannot see him until later this month. Denies dysphagia. Denies drooling. Denies fever or chills. He has tried OTC medications with no relief.  Past Medical History:  Diagnosis Date  . Chronic dental pain     There are no active problems to display for this patient.   Past Surgical History:  Procedure Laterality Date  . APPENDECTOMY         Home Medications    Prior to Admission medications   Medication Sig Start Date End Date Taking? Authorizing Provider  albuterol (PROVENTIL HFA;VENTOLIN HFA) 108 (90 Base) MCG/ACT inhaler Inhale 2 puffs into the lungs every 6 (six) hours as needed for wheezing or shortness of breath.    Historical Provider, MD  FLUoxetine (PROZAC) 20 MG capsule Take 20 mg by mouth daily.    Historical Provider, MD  gabapentin (NEURONTIN) 300 MG capsule Take 300 mg by mouth 2 (two) times daily.    Historical Provider, MD  HYDROcodone-acetaminophen (NORCO/VICODIN) 5-325 MG tablet Take 1 tablet by mouth every 4 (four) hours as needed for severe pain. 08/26/16   Ace Gins Destenie Ingber, PA-C  ibuprofen (ADVIL,MOTRIN) 200 MG tablet Take 400 mg by mouth every 6 (six) hours as needed for moderate pain.    Historical Provider, MD  naproxen (NAPROSYN) 500 MG tablet Take 1 tablet (500 mg total) by mouth 2 (two) times daily. 08/26/16   Ace Gins Finley Dinkel, PA-C  penicillin v potassium (VEETID) 500 MG tablet Take 1 tablet (500 mg total) by  mouth 3 (three) times daily. 08/26/16 09/02/16  Ace Gins Maili Shutters, PA-C  phenytoin (DILANTIN) 100 MG ER capsule Take 100 mg by mouth 2 (two) times daily.    Historical Provider, MD    Family History No family history on file.  Social History Social History  Substance Use Topics  . Smoking status: Current Every Day Smoker    Packs/day: 0.50    Years: 1.50    Types: Cigarettes  . Smokeless tobacco: Never Used  . Alcohol use No     Allergies   Tramadol   Review of Systems Review of Systems 10 Systems reviewed and are negative for acute change except as noted in the HPI.  Physical Exam Updated Vital Signs BP 131/77 (BP Location: Right Arm)   Pulse 65   Temp 98.4 F (36.9 C) (Oral)   Resp 16   Ht 5\' 6"  (1.676 m)   Wt 83.5 kg   SpO2 97%   BMI 29.70 kg/m   Physical Exam  Constitutional: He is oriented to person, place, and time. No distress.  HENT:  Head: Atraumatic.  Right Ear: External ear normal.  Left Ear: External ear normal.  Nose: Nose normal.  Poor dentition. All teeth are broken at the level of the gingiva. The visible portions of the root are decaying. Gingiva is diffusely boggy. Pt is tender along right mandible. No gross abscess  visualized. Uvula midline and no trismus.  Eyes: Conjunctivae are normal. No scleral icterus.  Cardiovascular: Normal rate and regular rhythm.   Pulmonary/Chest: Effort normal. No respiratory distress.  Abdominal: He exhibits no distension.  Neurological: He is alert and oriented to person, place, and time.  Skin: Skin is warm and dry. He is not diaphoretic.  Psychiatric: He has a normal mood and affect. His behavior is normal.  Nursing note and vitals reviewed.    ED Treatments / Results  Labs (all labs ordered are listed, but only abnormal results are displayed) Labs Reviewed - No data to display  EKG  EKG Interpretation None       Radiology No results found.  Procedures Procedures (including critical care  time)  Medications Ordered in ED Medications - No data to display   Initial Impression / Assessment and Plan / ED Course  I have reviewed the triage vital signs and the nursing notes.  Pertinent labs & imaging results that were available during my care of the patient were reviewed by me and considered in my medical decision making (see chart for details).  Clinical Course   Pt is an 35 y.o. male with extremely poor dentition who will likely require full mouth extractions. He has an appt at the end of this month with Dr. [name] (Dr. [name]). Encouraged to keep appt as scheduled. In the meantime we will plan to cover with a course of Pen VK and naproxen. Short course of norco prescribed as well. I reviewed the [place] Narcotic Database and pt has had no controlled substance rx filled in the past six months. Pt is otherwise tolerating secretions and breathing comfortably with no evidence of deeper neck space infection. ER return precautions given.  Final Clinical Impressions(s) / ED Diagnoses   Final diagnoses:  Pain due to dental caries    New Prescriptions Discharge Medication List as of 08/26/2016  4:12 PM    START taking these medications   Details  HYDROcodone-acetaminophen (NORCO/VICODIN) 5-325 MG tablet Take 1 tablet by mouth every 4 (four) hours as needed for severe pain., Starting Mon 08/26/2016, Print    naproxen (NAPROSYN) 500 MG tablet Take 1 tablet (500 mg total) by mouth 2 (two) times daily., Starting Mon 08/26/2016, Print    penicillin v potassium (VEETID) 500 MG tablet Take 1 tablet (500 mg total) by mouth 3 (three) times daily., Starting Mon 08/26/2016, Until Mon 09/02/2016, Print         Carlene CoriaSerena Y Valentina Alcoser, PA-C 08/26/16 1940    Pricilla LovelessScott Goldston, MD 08/27/16 1721

## 2016-10-19 ENCOUNTER — Encounter (HOSPITAL_COMMUNITY): Payer: Self-pay | Admitting: Emergency Medicine

## 2016-10-19 ENCOUNTER — Emergency Department (HOSPITAL_COMMUNITY): Payer: Self-pay

## 2016-10-19 ENCOUNTER — Emergency Department (HOSPITAL_COMMUNITY)
Admission: EM | Admit: 2016-10-19 | Discharge: 2016-10-19 | Disposition: A | Discharge status: Home / Self Care | Payer: Self-pay | Attending: Emergency Medicine | Admitting: Emergency Medicine

## 2016-10-19 DIAGNOSIS — F17210 Nicotine dependence, cigarettes, uncomplicated: Secondary | ICD-10-CM | POA: Insufficient documentation

## 2016-10-19 DIAGNOSIS — L03211 Cellulitis of face: Secondary | ICD-10-CM | POA: Insufficient documentation

## 2016-10-19 DIAGNOSIS — Z79899 Other long term (current) drug therapy: Secondary | ICD-10-CM | POA: Insufficient documentation

## 2016-10-19 DIAGNOSIS — K047 Periapical abscess without sinus: Secondary | ICD-10-CM | POA: Insufficient documentation

## 2016-10-19 MED ORDER — HYDROCODONE-ACETAMINOPHEN 5-325 MG PO TABS: 1.0000 | ORAL_TABLET | ORAL | 0 refills | Status: DC | PRN

## 2016-10-19 MED ORDER — ONDANSETRON HCL 4 MG/2ML IJ SOLN
4.0000 mg | Freq: Once | INTRAMUSCULAR | Status: AC
  Administered 2016-10-19: 4 mg via INTRAVENOUS
  Filled 2016-10-19: qty 2

## 2016-10-19 MED ORDER — CLINDAMYCIN PHOSPHATE 600 MG/50ML IV SOLN
600.0000 mg | Freq: Once | INTRAVENOUS | Status: AC
  Administered 2016-10-19: 600 mg via INTRAVENOUS
  Filled 2016-10-19: qty 50

## 2016-10-19 MED ORDER — HYDROCODONE-ACETAMINOPHEN 5-325 MG PO TABS
2.0000 | ORAL_TABLET | Freq: Once | ORAL | Status: AC
  Administered 2016-10-19: 2 via ORAL
  Filled 2016-10-19: qty 2

## 2016-10-19 MED ORDER — KETOROLAC TROMETHAMINE 30 MG/ML IJ SOLN
30.0000 mg | Freq: Once | INTRAMUSCULAR | Status: AC
  Administered 2016-10-19: 30 mg via INTRAVENOUS
  Filled 2016-10-19: qty 1

## 2016-10-19 MED ORDER — CLINDAMYCIN HCL 150 MG PO CAPS: ORAL_CAPSULE | ORAL | 0 refills | Status: AC

## 2016-10-19 MED ORDER — IBUPROFEN 600 MG PO TABS: 600.0000 mg | ORAL_TABLET | Freq: Four times a day (QID) | ORAL | 0 refills | Status: DC

## 2016-10-19 MED ORDER — IOPAMIDOL (ISOVUE-300) INJECTION 61%
75.0000 mL | Freq: Once | INTRAVENOUS | Status: AC | PRN
  Administered 2016-10-19: 75 mL via INTRAVENOUS

## 2016-10-19 NOTE — ED Triage Notes (Signed)
Pt reports abscess to upper left tooth x2 days and swelling, denies drainage.  Airway patent.

## 2016-10-19 NOTE — Discharge Instructions (Addendum)
Your CT scan reveals abscess areas present on the left greater than other areas. This is advanced to cellulitis of a portion of your face. This can be dangerous. It is extremely important that you see a dentist systems possible and have this tooth #12 removed. Please use clindamycin with breakfast, lunch, dinner. Use 600 mg of ibuprofen with breakfast, lunch, dinner, and at bedtime. May use Norco for more severe pain.

## 2016-10-19 NOTE — ED Provider Notes (Signed)
[facility] DEPT Provider Note   CSN: [id number] Arrival date & time: 10/19/16  0825     History   Chief Complaint Chief Complaint  Patient presents with  . Dental Pain    HPI Johnathan Welch is a 35 y.o. male.  The history is provided by the patient.  Dental Pain   This is a chronic problem. The current episode started 2 days ago. The problem occurs hourly. The problem has been gradually worsening. The pain is moderate. He has tried nothing for the symptoms. The treatment provided no relief.    Past Medical History:  Diagnosis Date  . Chronic dental pain     There are no active problems to display for this patient.   Past Surgical History:  Procedure Laterality Date  . APPENDECTOMY         Home Medications    Prior to Admission medications   Medication Sig Start Date End Date Taking? Authorizing Provider  albuterol (PROVENTIL HFA;VENTOLIN HFA) 108 (90 Base) MCG/ACT inhaler Inhale 2 puffs into the lungs every 6 (six) hours as needed for wheezing or shortness of breath.    Historical Provider, MD  FLUoxetine (PROZAC) 20 MG capsule Take 20 mg by mouth daily.    Historical Provider, MD  gabapentin (NEURONTIN) 300 MG capsule Take 300 mg by mouth 2 (two) times daily.    Historical Provider, MD  HYDROcodone-acetaminophen (NORCO/VICODIN) 5-325 MG tablet Take 1 tablet by mouth every 4 (four) hours as needed for severe pain. 08/26/16   Ace GinsSerena Y Sam, PA-C  ibuprofen (ADVIL,MOTRIN) 200 MG tablet Take 400 mg by mouth every 6 (six) hours as needed for moderate pain.    Historical Provider, MD  naproxen (NAPROSYN) 500 MG tablet Take 1 tablet (500 mg total) by mouth 2 (two) times daily. 08/26/16   Ace GinsSerena Y Sam, PA-C  phenytoin (DILANTIN) 100 MG ER capsule Take 100 mg by mouth 2 (two) times daily.    Historical Provider, MD    Family History History reviewed. No pertinent family history.  Social History Social History  Substance Use Topics  . Smoking status: Current  Every Day Smoker    Packs/day: 0.50    Years: 1.50    Types: Cigarettes  . Smokeless tobacco: Never Used  . Alcohol use No     Allergies   Tramadol   Review of Systems Review of Systems  Constitutional: Negative for activity change.       All ROS Neg except as noted in HPI  HENT: Positive for dental problem. Negative for drooling and nosebleeds.   Eyes: Negative for photophobia and discharge.  Respiratory: Negative for cough, shortness of breath and wheezing.   Cardiovascular: Negative for chest pain and palpitations.  Gastrointestinal: Negative for abdominal pain and blood in stool.  Genitourinary: Negative for dysuria, frequency and hematuria.  Musculoskeletal: Negative for arthralgias, back pain and neck pain.  Skin: Negative.   Neurological: Negative for dizziness, seizures and speech difficulty.  Psychiatric/Behavioral: Negative for confusion and hallucinations.     Physical Exam Updated Vital Signs BP (!) 146/104 (BP Location: Left Arm)   Pulse 73   Temp 97.7 F (36.5 C) (Oral)   Resp 18   Ht 5\' 6"  (1.676 m)   Wt 83.9 kg   SpO2 99%   BMI 29.86 kg/m   Physical Exam  Constitutional: He is oriented to person, place, and time. He appears well-developed and well-nourished.  Non-toxic appearance.  HENT:  Head: Normocephalic.  Right Ear: Tympanic membrane  and external ear normal.  Left Ear: Tympanic membrane and external ear normal.  There is pain and puffiness from the left nasolabial fold down to the left upper gum. There is a small abscess noted over the left upper canine area. All of the teeth top and bottom of either decayed or broken down to the gingiva on. There is swelling of the upper more than lower gum. The airway is patent. There is no swelling under the tongue.  Eyes: EOM and lids are normal. Pupils are equal, round, and reactive to light.  Neck: Normal range of motion. Neck supple. Carotid bruit is not present.  Cardiovascular: Normal rate, regular  rhythm, normal heart sounds, intact distal pulses and normal pulses.   Pulmonary/Chest: Breath sounds normal. No respiratory distress.  Abdominal: Soft. Bowel sounds are normal. There is no tenderness. There is no guarding.  Musculoskeletal: Normal range of motion.  Lymphadenopathy:       Head (right side): No submandibular adenopathy present.       Head (left side): No submandibular adenopathy present.    He has no cervical adenopathy.  Neurological: He is alert and oriented to person, place, and time. He has normal strength. No cranial nerve deficit or sensory deficit.  Skin: Skin is warm and dry.  Psychiatric: He has a normal mood and affect. His speech is normal.  Nursing note and vitals reviewed.    ED Treatments / Results  Labs (all labs ordered are listed, but only abnormal results are displayed) Labs Reviewed - No data to display  EKG  EKG Interpretation None       Radiology No results found.  Procedures Procedures (including critical care time)  Medications Ordered in ED Medications - No data to display   Initial Impression / Assessment and Plan / ED Course  I have reviewed the triage vital signs and the nursing notes.  Pertinent labs & imaging results that were available during my care of the patient were reviewed by me and considered in my medical decision making (see chart for details).  Clinical Course     *I have reviewed nursing notes, vital signs, and all appropriate lab and imaging results for this patient.**  Final Clinical Impressions(s) / ED Diagnoses  CT of the maxillofacial area with contrast suggested abscess present. And left-sided cellulitis involving the base. The vital signs are well within normal limits. The patient appears to be stable and in no acute distress. The patient was treated with intravenous clindamycin. Prescription also given for oral clindamycin and Norco and ibuprofen. I have strongly advised the patient to see a dentist as  soon as possible as this could be potentially dangerous. The patient acknowledges understanding of the instructions, he acknowledges understanding of the dangerous nature of this medical problem.    Final diagnoses:  None    New Prescriptions New Prescriptions   No medications on file     Ivery QualeHobson Tecia Cinnamon, PA-C 10/20/16 2027    Samuel JesterKathleen McManus, DO 10/22/16 1628

## 2018-07-28 DIAGNOSIS — M222X2 Patellofemoral disorders, left knee: Secondary | ICD-10-CM | POA: Diagnosis not present

## 2018-12-24 DIAGNOSIS — F1120 Opioid dependence, uncomplicated: Secondary | ICD-10-CM | POA: Diagnosis not present

## 2018-12-24 DIAGNOSIS — R825 Elevated urine levels of drugs, medicaments and biological substances: Secondary | ICD-10-CM | POA: Diagnosis not present

## 2018-12-30 DIAGNOSIS — F1120 Opioid dependence, uncomplicated: Secondary | ICD-10-CM | POA: Diagnosis not present

## 2019-01-04 DIAGNOSIS — F1120 Opioid dependence, uncomplicated: Secondary | ICD-10-CM | POA: Diagnosis not present

## 2019-01-06 DIAGNOSIS — F1120 Opioid dependence, uncomplicated: Secondary | ICD-10-CM | POA: Diagnosis not present

## 2019-01-13 DIAGNOSIS — F1120 Opioid dependence, uncomplicated: Secondary | ICD-10-CM | POA: Diagnosis not present

## 2019-02-16 DIAGNOSIS — M25562 Pain in left knee: Secondary | ICD-10-CM | POA: Diagnosis not present

## 2019-02-16 DIAGNOSIS — J449 Chronic obstructive pulmonary disease, unspecified: Secondary | ICD-10-CM | POA: Diagnosis not present

## 2019-02-16 DIAGNOSIS — G40309 Generalized idiopathic epilepsy and epileptic syndromes, not intractable, without status epilepticus: Secondary | ICD-10-CM | POA: Diagnosis not present

## 2019-02-16 DIAGNOSIS — F17210 Nicotine dependence, cigarettes, uncomplicated: Secondary | ICD-10-CM | POA: Diagnosis not present

## 2019-02-16 DIAGNOSIS — F339 Major depressive disorder, recurrent, unspecified: Secondary | ICD-10-CM | POA: Diagnosis not present

## 2019-03-16 ENCOUNTER — Other Ambulatory Visit: Payer: Self-pay

## 2019-03-16 ENCOUNTER — Ambulatory Visit (INDEPENDENT_AMBULATORY_CARE_PROVIDER_SITE_OTHER): Payer: Medicare HMO | Admitting: Orthopaedic Surgery

## 2019-03-16 ENCOUNTER — Encounter: Payer: Self-pay | Admitting: Orthopaedic Surgery

## 2019-03-16 DIAGNOSIS — M25562 Pain in left knee: Secondary | ICD-10-CM | POA: Diagnosis not present

## 2019-03-16 DIAGNOSIS — F17210 Nicotine dependence, cigarettes, uncomplicated: Secondary | ICD-10-CM

## 2019-03-16 DIAGNOSIS — G8929 Other chronic pain: Secondary | ICD-10-CM | POA: Diagnosis not present

## 2019-03-16 MED ORDER — NAPROXEN 500 MG PO TABS: 500.0000 mg | ORAL_TABLET | Freq: Two times a day (BID) | ORAL | 5 refills | Status: DC

## 2019-03-16 NOTE — Progress Notes (Signed)
Virtual Visit via Telephone Note  I connected with Tereso Welch on 03/16/19 at  9:50 AM EDT by telephone and verified that I am speaking with the correct person using two identifiers.   I discussed the limitations, risks, security and privacy concerns of performing an evaluation and management service by telephone and the availability of in person appointments. I also discussed with the patient that there may be a patient responsible charge related to this service. The patient expressed understanding and agreed to proceed.   History of Present Illness: He has history of chronic left knee pain for years.  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  He has had pain since then.  He has been followed by the health department and seen by Dr. Danae Orleans in [place] at the orthopedic office there of [facility], seen in September, 2019.  More recently he has gotten insurance and was seen by Dr. Felecia Shelling on 02-16-2019.  Dr. Felecia Shelling has asked that I see the patient.  He has taken ibuprofen for his knee, three 200 mgm tablets twice a day with only some help.  He has also taken Tylenol.  He has used heat and ice.  He has swelling of the knee on the left, some popping and no redness, no new trauma.  He has no giving way or locking of the knee.  It hurts most of the time now.  He wanted pain medicine for the knee.  He had x-rays done at [facility] in September.  I have reviewed the notes from [facility] via Care Everywhere on Epic and the x-ray reports.  I have read the notes from Dr. Felecia Shelling sent in by fax.  He was given naprosyn in September and he said that helped some.  He has not been back to [place].  He did go to PT after the visits.  He had been seen in the ER prior to the orthopedic appointment and I have reviewed those notes also.  He lives in [place].  He has other medical problems including COPD, he does smoke about a pack a day of cigarettes, he has epilepsy and major  depressive disorder according to Dr. Letitia Neri notes.   Observations/Objective: Per above  Assessment and Plan: Encounter Diagnoses  Name Primary?  . Chronic pain of left knee Yes  . Cigarette nicotine dependence without complication      Follow Up Instructions: I will call in Naprosyn for him to take, one bid. Stop the ibuprofen.  I will not call in narcotic.  I feel he needs a MRI of the left knee.  MRI are not being done now secondary to COVID-19.  I have told him this.  I will do a virtual visit in one month.   I discussed the assessment and treatment plan with the patient. The patient was provided an opportunity to ask questions and all were answered. The patient agreed with the plan and demonstrated an understanding of the instructions.   The patient was advised to call back or seek an in-person evaluation if the symptoms worsen or if the condition fails to improve as anticipated.  I provided 35 minutes of non-face-to-face time during this encounter. This includes reviewing multiple old notes and records and xray and ER reports.   Johnathan Mclean, MD

## 2019-04-07 ENCOUNTER — Ambulatory Visit: Payer: Self-pay | Admitting: Orthopaedic Surgery

## 2019-04-08 DIAGNOSIS — M25562 Pain in left knee: Secondary | ICD-10-CM | POA: Diagnosis not present

## 2019-04-08 DIAGNOSIS — R52 Pain, unspecified: Secondary | ICD-10-CM | POA: Diagnosis not present

## 2019-04-13 ENCOUNTER — Ambulatory Visit: Payer: Self-pay | Admitting: Orthopaedic Surgery

## 2019-04-14 ENCOUNTER — Ambulatory Visit: Payer: Self-pay | Admitting: Orthopaedic Surgery

## 2019-04-15 ENCOUNTER — Other Ambulatory Visit: Payer: Self-pay

## 2019-04-15 ENCOUNTER — Ambulatory Visit: Payer: Medicare HMO | Admitting: Orthopaedic Surgery

## 2019-04-15 ENCOUNTER — Encounter: Payer: Self-pay | Admitting: Orthopaedic Surgery

## 2019-04-15 ENCOUNTER — Ambulatory Visit (INDEPENDENT_AMBULATORY_CARE_PROVIDER_SITE_OTHER): Payer: Medicare HMO | Admitting: Orthopaedic Surgery

## 2019-04-15 DIAGNOSIS — G8929 Other chronic pain: Secondary | ICD-10-CM

## 2019-04-15 DIAGNOSIS — M25562 Pain in left knee: Secondary | ICD-10-CM | POA: Diagnosis not present

## 2019-04-15 DIAGNOSIS — F17210 Nicotine dependence, cigarettes, uncomplicated: Secondary | ICD-10-CM

## 2019-04-15 NOTE — Progress Notes (Signed)
Virtual Visit via Telephone Note  I connected with [name] on 04/15/19 at  9:40 AM EDT by telephone and verified that I am speaking with the correct person using two identifiers.  Location: Patient: home Provider: office   I discussed the limitations, risks, security and privacy concerns of performing an evaluation and management service by telephone and the availability of in person appointments. I also discussed with the patient that there may be a patient responsible charge related to this service. The patient expressed understanding and agreed to proceed.   History of Present Illness: He continues to have pain of the left knee with swelling, popping and giving way.  He has been on the Naprosyn.  I will order MRI of the left knee as I am concerned about a medial meniscus tear.   Observations/Objective: Per Above  Assessment and Plan: Encounter Diagnoses  Name Primary?  . Chronic pain of left knee Yes  . Cigarette nicotine dependence without complication      Follow Up Instructions: Get MRI, then see in office.   I discussed the assessment and treatment plan with the patient. The patient was provided an opportunity to ask questions and all were answered. The patient agreed with the plan and demonstrated an understanding of the instructions.   The patient was advised to call back or seek an in-person evaluation if the symptoms worsen or if the condition fails to improve as anticipated.  I provided 8 minutes of non-face-to-face time during this encounter.   Darreld Mclean, MD

## 2019-04-21 ENCOUNTER — Ambulatory Visit (HOSPITAL_COMMUNITY)
Admission: RE | Admit: 2019-04-21 | Discharge: 2019-04-21 | Disposition: A | Discharge status: Home / Self Care | Payer: Medicare HMO | Source: Ambulatory Visit | Attending: Orthopaedic Surgery | Admitting: Orthopaedic Surgery

## 2019-04-21 ENCOUNTER — Ambulatory Visit: Payer: Self-pay | Admitting: Orthopaedic Surgery

## 2019-04-21 ENCOUNTER — Other Ambulatory Visit: Payer: Self-pay

## 2019-04-21 DIAGNOSIS — G8929 Other chronic pain: Secondary | ICD-10-CM | POA: Diagnosis not present

## 2019-04-21 DIAGNOSIS — M25562 Pain in left knee: Secondary | ICD-10-CM | POA: Diagnosis not present

## 2019-04-22 ENCOUNTER — Ambulatory Visit (INDEPENDENT_AMBULATORY_CARE_PROVIDER_SITE_OTHER): Payer: Medicare HMO | Admitting: Orthopaedic Surgery

## 2019-04-22 ENCOUNTER — Encounter: Payer: Self-pay | Admitting: Orthopaedic Surgery

## 2019-04-22 ENCOUNTER — Other Ambulatory Visit: Payer: Self-pay | Admitting: Radiology

## 2019-04-22 VITALS — BP 127/83 | HR 60 | Temp 97.7°F | Ht 66.0 in | Wt 182.0 lb

## 2019-04-22 DIAGNOSIS — M25562 Pain in left knee: Secondary | ICD-10-CM

## 2019-04-22 DIAGNOSIS — F17210 Nicotine dependence, cigarettes, uncomplicated: Secondary | ICD-10-CM | POA: Diagnosis not present

## 2019-04-22 DIAGNOSIS — G8929 Other chronic pain: Secondary | ICD-10-CM | POA: Diagnosis not present

## 2019-04-22 NOTE — Progress Notes (Signed)
[name], male DOB:01-May-1981, 38 y.o. LYH:[id number]  Chief Complaint  Patient presents with  . Knee Pain    HPI  Johnathan Welch is a 38 y.o. male who has chronic left knee pain.  He had MRI which was negative.  I have informed him of the findings.  He will be set up for PT.  He is on Naprosyn.  He is to continue the Naprosyn.  I will see him in one month.  He does not need surgery.   Body mass index is 29.38 kg/m.  ROS  Review of Systems  Constitutional: Positive for activity change.  Musculoskeletal: Positive for arthralgias, gait problem and joint swelling.  Psychiatric/Behavioral: The patient is nervous/anxious.   All other systems reviewed and are negative.   All other systems reviewed and are negative.  The following is a summary of the past history medically, past history surgically, known current medicines, social history and family history.  This information is gathered electronically by the computer from prior information and documentation.  I review this each visit and have found including this information at this point in the chart is beneficial and informative.    Past Medical History:  Diagnosis Date  . Chronic dental pain     Past Surgical History:  Procedure Laterality Date  . APPENDECTOMY      History reviewed. No pertinent family history.  Social History Social History   Tobacco Use  . Smoking status: Current Every Day Smoker    Packs/day: 0.50    Years: 1.50    Pack years: 0.75    Types: Cigarettes  . Smokeless tobacco: Never Used  Substance Use Topics  . Alcohol use: No  . Drug use: No    Allergies  Allergen Reactions  . Tramadol Hives and Nausea And Vomiting    Current Outpatient Medications  Medication Sig Dispense Refill  . albuterol (PROVENTIL HFA;VENTOLIN HFA) 108 (90 Base) MCG/ACT inhaler Inhale 2 puffs into the lungs every 6 (six) hours as needed for wheezing or shortness of breath.    . clindamycin  (CLEOCIN) 150 MG capsule 2 tabs po tid with food 42 capsule 0  . FLUoxetine (PROZAC) 20 MG capsule Take 20 mg by mouth daily.    Marland Kitchen gabapentin (NEURONTIN) 300 MG capsule Take 300 mg by mouth 2 (two) times daily.    Marland Kitchen HYDROcodone-acetaminophen (NORCO/VICODIN) 5-325 MG tablet Take 1 tablet by mouth every 4 (four) hours as needed. 15 tablet 0  . ibuprofen (ADVIL,MOTRIN) 600 MG tablet Take 1 tablet (600 mg total) by mouth 4 (four) times daily. 30 tablet 0  . naproxen (NAPROSYN) 500 MG tablet Take 1 tablet (500 mg total) by mouth 2 (two) times daily with a meal. 60 tablet 5  . phenytoin (DILANTIN) 100 MG ER capsule Take 100 mg by mouth 2 (two) times daily.     No current facility-administered medications for this visit.      Physical Exam  Blood pressure 127/83, pulse 60, temperature 97.7 F (36.5 C), height 5\' 6"  (1.676 m), weight 182 lb (82.6 kg).  Constitutional: overall normal hygiene, normal nutrition, well developed, normal grooming, normal body habitus. Assistive device:none  Musculoskeletal: gait and station Limp none, muscle tone and strength are normal, no tremors or atrophy is present.  .  Neurological: coordination overall normal.  Deep tendon reflex/nerve stretch intact.  Sensation normal.  Cranial nerves II-XII intact.   Skin:   Normal overall no scars, lesions, ulcers or rashes. No psoriasis.  Psychiatric: Alert and oriented x 3.  Recent memory intact, remote memory unclear.  Normal mood and affect. Well groomed.  Good eye contact.  Cardiovascular: overall no swelling, no varicosities, no edema bilaterally, normal temperatures of the legs and arms, no clubbing, cyanosis and good capillary refill.  Lymphatic: palpation is normal.  Left knee is tender. ROM is nearly full today, no effusion, has some crepitus.  NV intact.  All other systems reviewed and are negative   The patient has been educated about the nature of the problem(s) and counseled on treatment options.  The  patient appeared to understand what I have discussed and is in agreement with it.  Encounter Diagnoses  Name Primary?  . Chronic pain of left knee Yes  . Cigarette nicotine dependence without complication     PLAN Call if any problems.  Precautions discussed.  Continue current medications.   Return to clinic 1 month   Begin PT.  Electronically Signed Darreld McleanWayne Leith Hedlund, MD 5/28/202010:51 AM

## 2019-04-22 NOTE — Patient Instructions (Signed)
Steps to Quit Smoking    Smoking tobacco can be bad for your health. It can also affect almost every organ in your body. Smoking puts you and people around you at risk for many serious long-lasting (chronic) diseases. Quitting smoking is hard, but it is one of the best things that you can do for your health. It is never too late to quit.  What are the benefits of quitting smoking?  When you quit smoking, you lower your risk for getting serious diseases and conditions. They can include:  · Lung cancer or lung disease.  · Heart disease.  · Stroke.  · Heart attack.  · Not being able to have children (infertility).  · Weak bones (osteoporosis) and broken bones (fractures).  If you have coughing, wheezing, and shortness of breath, those symptoms may get better when you quit. You may also get sick less often. If you are pregnant, quitting smoking can help to lower your chances of having a baby of low birth weight.  What can I do to help me quit smoking?  Talk with your doctor about what can help you quit smoking. Some things you can do (strategies) include:  · Quitting smoking totally, instead of slowly cutting back how much you smoke over a period of time.  · Going to in-person counseling. You are more likely to quit if you go to many counseling sessions.  · Using resources and support systems, such as:  ? Online chats with a counselor.  ? Phone quitlines.  ? Printed self-help materials.  ? Support groups or group counseling.  ? Text messaging programs.  ? Mobile phone apps or applications.  · Taking medicines. Some of these medicines may have nicotine in them. If you are pregnant or breastfeeding, do not take any medicines to quit smoking unless your doctor says it is okay. Talk with your doctor about counseling or other things that can help you.  Talk with your doctor about using more than one strategy at the same time, such as taking medicines while you are also going to in-person counseling. This can help make  quitting easier.  What things can I do to make it easier to quit?  Quitting smoking might feel very hard at first, but there is a lot that you can do to make it easier. Take these steps:  · Talk to your family and friends. Ask them to support and encourage you.  · Call phone quitlines, reach out to support groups, or work with a counselor.  · Ask people who smoke to not smoke around you.  · Avoid places that make you want (trigger) to smoke, such as:  ? Bars.  ? Parties.  ? Smoke-break areas at work.  · Spend time with people who do not smoke.  · Lower the stress in your life. Stress can make you want to smoke. Try these things to help your stress:  ? Getting regular exercise.  ? Deep-breathing exercises.  ? Yoga.  ? Meditating.  ? Doing a body scan. To do this, close your eyes, focus on one area of your body at a time from head to toe, and notice which parts of your body are tense. Try to relax the muscles in those areas.  · Download or buy apps on your mobile phone or tablet that can help you stick to your quit plan. There are many free apps, such as QuitGuide from the CDC (Centers for Disease Control and Prevention). You can find more   support from smokefree.gov and other websites.  This information is not intended to replace advice given to you by your health care provider. Make sure you discuss any questions you have with your health care provider.  Document Released: 09/07/2009 Document Revised: 07/09/2016 Document Reviewed: 03/28/2015  Elsevier Interactive Patient Education © 2019 [employer]vier Inc.

## 2019-04-26 ENCOUNTER — Telehealth: Payer: Self-pay | Admitting: Orthopaedic Surgery

## 2019-04-26 NOTE — Telephone Encounter (Signed)
Patient called asking if you would do a prescription for pain medication.  States he will need something to help with the pain while doing therapy.  PATIENT USES [facility]

## 2019-04-27 NOTE — Telephone Encounter (Signed)
Johnathan Welch with patient and relayed message. He stated he guess he would get his family doctor to refer him to a pain clinic.

## 2019-04-27 NOTE — Telephone Encounter (Signed)
That is what we dicussed the other day.  Same tune, same verse.

## 2019-04-27 NOTE — Telephone Encounter (Signed)
No.  As was stated in the office on his visit he has been on buprenorphine this year and I cannot give narcotic.  He can get that one from the doctor that Rxed that drug earlier this year.

## 2019-05-05 ENCOUNTER — Telehealth: Payer: Self-pay | Admitting: Orthopaedic Surgery

## 2019-05-05 NOTE — Telephone Encounter (Signed)
FYIJoellen Jersey from [facility] called to let you know that they had been trying to reach the patient since 5/28 and finally today he called them back. Stated patient told her that you had said that he could use the physical therapy if he wanted to and if not it was ok with you. Patient stated to Joellen Jersey that he was going to try to do a couple of things on his own before he tries therapy. Katie told him that his order was good until the 05/23/19 and if it runs out they would request another for him.

## 2019-05-13 ENCOUNTER — Telehealth: Payer: Self-pay | Admitting: Orthopaedic Surgery

## 2019-05-13 NOTE — Telephone Encounter (Signed)
Patient called to relay that he was prescribed prednisone about a year ago, and asked if Dr Luna Glasgow would prescribe. Relayed that since it was that long ago, Dr Luna Glasgow would not prescribe without seeing patient and evaluating. At patient's most recent visit with Dr Luna Glasgow on 04/22/19, no new medications prescribed. Patient said he will check with his primary care doctor.

## 2019-05-19 ENCOUNTER — Encounter: Payer: Self-pay | Admitting: Orthopaedic Surgery

## 2019-05-19 ENCOUNTER — Other Ambulatory Visit: Payer: Self-pay

## 2019-05-19 ENCOUNTER — Ambulatory Visit (INDEPENDENT_AMBULATORY_CARE_PROVIDER_SITE_OTHER): Payer: Medicare HMO | Admitting: Orthopaedic Surgery

## 2019-05-19 VITALS — BP 132/97 | HR 82 | Ht 66.0 in | Wt 186.0 lb

## 2019-05-19 DIAGNOSIS — G8929 Other chronic pain: Secondary | ICD-10-CM

## 2019-05-19 DIAGNOSIS — M25562 Pain in left knee: Secondary | ICD-10-CM

## 2019-05-19 DIAGNOSIS — F17210 Nicotine dependence, cigarettes, uncomplicated: Secondary | ICD-10-CM

## 2019-05-19 MED ORDER — HYDROCODONE-ACETAMINOPHEN 5-325 MG PO TABS: ORAL_TABLET | ORAL | 0 refills | Status: DC

## 2019-05-19 NOTE — Addendum Note (Signed)
Addended by: Derek Mound A on: 05/19/2019 12:19 PM   Modules accepted: Orders

## 2019-05-19 NOTE — Patient Instructions (Signed)

## 2019-05-19 NOTE — Progress Notes (Signed)
[name], male DOB:1980-12-15, 38 y.o. OHY:[id number]  Chief Complaint  Patient presents with  . Knee Pain    L/ Sharp pain    HPI  Johnathan Welch is a 38 y.o. male who has continued pain of the left knee.  His MRI was negative recently.  He was to go to PT but he did not.  He refuses injection.  He is taking the Naprosyn but says it does not help.  I will arrange for him to go to a pain clinic.  He has seen one before.  I will give five days of pain medicine.   Body mass index is 30.02 kg/m.  ROS  Review of Systems  Constitutional: Positive for activity change.  Musculoskeletal: Positive for arthralgias, gait problem and joint swelling.  Psychiatric/Behavioral: The patient is nervous/anxious.   All other systems reviewed and are negative.   All other systems reviewed and are negative.  The following is a summary of the past history medically, past history surgically, known current medicines, social history and family history.  This information is gathered electronically by the computer from prior information and documentation.  I review this each visit and have found including this information at this point in the chart is beneficial and informative.    Past Medical History:  Diagnosis Date  . Chronic dental pain     Past Surgical History:  Procedure Laterality Date  . APPENDECTOMY      History reviewed. No pertinent family history.  Social History Social History   Tobacco Use  . Smoking status: Current Every Day Smoker    Packs/day: 0.50    Years: 1.50    Pack years: 0.75    Types: Cigarettes  . Smokeless tobacco: Never Used  Substance Use Topics  . Alcohol use: No  . Drug use: No    Allergies  Allergen Reactions  . Tramadol Hives and Nausea And Vomiting    Current Outpatient Medications  Medication Sig Dispense Refill  . albuterol (PROVENTIL HFA;VENTOLIN HFA) 108 (90 Base) MCG/ACT inhaler Inhale 2 puffs into the lungs every  6 (six) hours as needed for wheezing or shortness of breath.    . Buprenorphine HCl-Naloxone HCl 8-2 MG FILM DISSOLVE 1 FILM UNDER THE TONGUE ONCE DAILY    . clindamycin (CLEOCIN) 150 MG capsule 2 tabs po tid with food 42 capsule 0  . FLUoxetine (PROZAC) 20 MG capsule Take 20 mg by mouth daily.    Marland Kitchen gabapentin (NEURONTIN) 300 MG capsule Take 300 mg by mouth 2 (two) times daily.    Marland Kitchen HYDROcodone-acetaminophen (NORCO/VICODIN) 5-325 MG tablet One tablet every four hours for pain. 30 tablet 0  . ibuprofen (ADVIL,MOTRIN) 600 MG tablet Take 1 tablet (600 mg total) by mouth 4 (four) times daily. 30 tablet 0  . naproxen (NAPROSYN) 500 MG tablet Take 1 tablet (500 mg total) by mouth 2 (two) times daily with a meal. 60 tablet 5  . phenytoin (DILANTIN) 100 MG ER capsule Take 100 mg by mouth 2 (two) times daily.     No current facility-administered medications for this visit.      Physical Exam  Blood pressure (!) 132/97, pulse 82, height 5\' 6"  (1.676 m), weight 186 lb (84.4 kg).  Constitutional: overall normal hygiene, normal nutrition, well developed, normal grooming, normal body habitus. Assistive device:none  Musculoskeletal: gait and station Limp left, muscle tone and strength are normal, no tremors or atrophy is present.  .  Neurological: coordination overall normal.  Deep tendon reflex/nerve stretch intact.  Sensation normal.  Cranial nerves II-XII intact.   Skin:   Normal overall no scars, lesions, ulcers or rashes. No psoriasis.  Psychiatric: Alert and oriented x 3.  Recent memory intact, remote memory unclear.  Normal mood and affect. Well groomed.  Good eye contact.  Cardiovascular: overall no swelling, no varicosities, no edema bilaterally, normal temperatures of the legs and arms, no clubbing, cyanosis and good capillary refill.  Lymphatic: palpation is normal.  Left knee has no effusion, ROM is full, NV intact, limp left, no redness.  All other systems reviewed and are negative    The patient has been educated about the nature of the problem(s) and counseled on treatment options.  The patient appeared to understand what I have discussed and is in agreement with it.  Encounter Diagnoses  Name Primary?  . Chronic pain of left knee Yes  . Cigarette nicotine dependence without complication     PLAN Call if any problems.  Precautions discussed.  Continue current medications.   Return to clinic to pain clinic   I have reviewed the [facility] [place] web site prior to prescribing narcotic medicine for this patient.   Electronically Signed Darreld McleanWayne Akosua Constantine, MD 6/24/202010:29 AM

## 2019-05-24 ENCOUNTER — Telehealth: Payer: Self-pay

## 2019-05-24 ENCOUNTER — Other Ambulatory Visit: Payer: Self-pay | Admitting: Orthopaedic Surgery

## 2019-05-24 MED ORDER — HYDROCODONE-ACETAMINOPHEN 5-325 MG PO TABS: ORAL_TABLET | ORAL | 0 refills | Status: DC

## 2019-05-24 NOTE — Telephone Encounter (Signed)
Hydrocodone-Acetaminophen 5/325 mg  Qty  30 Tablets  He hasn't heard from pain clinic yet.  PATIENT USES [facility]

## 2019-05-25 ENCOUNTER — Ambulatory Visit: Payer: Medicare HMO | Admitting: Orthopaedic Surgery

## 2019-05-25 DIAGNOSIS — G40309 Generalized idiopathic epilepsy and epileptic syndromes, not intractable, without status epilepticus: Secondary | ICD-10-CM | POA: Diagnosis not present

## 2019-05-25 DIAGNOSIS — Z0001 Encounter for general adult medical examination with abnormal findings: Secondary | ICD-10-CM | POA: Diagnosis not present

## 2019-05-25 DIAGNOSIS — F339 Major depressive disorder, recurrent, unspecified: Secondary | ICD-10-CM | POA: Diagnosis not present

## 2019-05-25 DIAGNOSIS — J449 Chronic obstructive pulmonary disease, unspecified: Secondary | ICD-10-CM | POA: Diagnosis not present

## 2019-05-25 DIAGNOSIS — Z1389 Encounter for screening for other disorder: Secondary | ICD-10-CM | POA: Diagnosis not present

## 2019-05-25 DIAGNOSIS — Z1331 Encounter for screening for depression: Secondary | ICD-10-CM | POA: Diagnosis not present

## 2019-05-31 ENCOUNTER — Telehealth: Payer: Self-pay | Admitting: Orthopaedic Surgery

## 2019-05-31 NOTE — Telephone Encounter (Signed)
Patient requests refill on Hydrocodone/Acetaminophen 5-325  Qty 28       Sig: One tablet every six hours for pain.   Patient states he uses [facility] in [place] TO KNOW THAT HE HAS AN [place] ON June 28, 2019

## 2019-06-01 MED ORDER — HYDROCODONE-ACETAMINOPHEN 5-325 MG PO TABS: ORAL_TABLET | ORAL | 0 refills | Status: DC

## 2019-06-07 ENCOUNTER — Telehealth: Payer: Self-pay

## 2019-06-07 NOTE — Telephone Encounter (Signed)
Hydrocodone-Acetaminophen  5/325 mg  Qty  25 Tablets   PATIENT USES [facility]

## 2019-06-08 MED ORDER — HYDROCODONE-ACETAMINOPHEN 5-325 MG PO TABS: ORAL_TABLET | ORAL | 0 refills | Status: DC

## 2019-06-14 ENCOUNTER — Other Ambulatory Visit: Payer: Self-pay | Admitting: Orthopaedic Surgery

## 2019-06-15 ENCOUNTER — Ambulatory Visit: Payer: Medicare HMO | Admitting: Orthopaedic Surgery

## 2019-06-24 DIAGNOSIS — M25562 Pain in left knee: Secondary | ICD-10-CM | POA: Diagnosis not present

## 2019-06-24 DIAGNOSIS — J449 Chronic obstructive pulmonary disease, unspecified: Secondary | ICD-10-CM | POA: Diagnosis not present

## 2019-06-24 DIAGNOSIS — F339 Major depressive disorder, recurrent, unspecified: Secondary | ICD-10-CM | POA: Diagnosis not present

## 2019-06-29 ENCOUNTER — Other Ambulatory Visit: Payer: Self-pay

## 2019-06-29 ENCOUNTER — Ambulatory Visit (INDEPENDENT_AMBULATORY_CARE_PROVIDER_SITE_OTHER): Payer: Medicare HMO | Admitting: Orthopaedic Surgery

## 2019-06-29 ENCOUNTER — Encounter: Payer: Self-pay | Admitting: Orthopaedic Surgery

## 2019-06-29 VITALS — BP 131/86 | HR 80 | Temp 97.3°F | Ht 66.0 in | Wt 175.0 lb

## 2019-06-29 DIAGNOSIS — M25562 Pain in left knee: Secondary | ICD-10-CM | POA: Diagnosis not present

## 2019-06-29 DIAGNOSIS — G8929 Other chronic pain: Secondary | ICD-10-CM | POA: Diagnosis not present

## 2019-06-29 DIAGNOSIS — F17210 Nicotine dependence, cigarettes, uncomplicated: Secondary | ICD-10-CM

## 2019-06-29 DIAGNOSIS — G894 Chronic pain syndrome: Secondary | ICD-10-CM | POA: Diagnosis not present

## 2019-06-29 MED ORDER — HYDROCODONE-ACETAMINOPHEN 5-325 MG PO TABS: ORAL_TABLET | ORAL | 0 refills | Status: DC

## 2019-06-29 NOTE — Progress Notes (Signed)
Patient ZO:XWRUEAV:Johnathan Welch, male DOB:[birth year], 38 y.o. WUJ:[id number]  Chief Complaint  Patient presents with  . Knee Pain    Chronic left knee pain.    HPI  Johnathan NewcomerMichael S Welch is a 38 y.o. male who has chronic left knee pain.  His pain clinic appointment has been moved back to August 31.  He has swelling and pain of the left knee.  He has no giving way.  I will continue weekly pain medicine until he goes to the pain clinic.   Body mass index is 28.25 kg/m.  ROS  Review of Systems  Constitutional: Positive for activity change.  Musculoskeletal: Positive for arthralgias, gait problem and joint swelling.  Psychiatric/Behavioral: The patient is nervous/anxious.   All other systems reviewed and are negative.   All other systems reviewed and are negative.  The following is a summary of the past history medically, past history surgically, known current medicines, social history and family history.  This information is gathered electronically by the computer from prior information and documentation.  I review this each visit and have found including this information at this point in the chart is beneficial and informative.    Past Medical History:  Diagnosis Date  . Chronic dental pain     Past Surgical History:  Procedure Laterality Date  . APPENDECTOMY      History reviewed. No pertinent family history.  Social History Social History   Tobacco Use  . Smoking status: Current Every Day Smoker    Packs/day: 0.50    Years: 1.50    Pack years: 0.75    Types: Cigarettes  . Smokeless tobacco: Never Used  Substance Use Topics  . Alcohol use: No  . Drug use: No    Allergies  Allergen Reactions  . Tramadol Hives and Nausea And Vomiting    Current Outpatient Medications  Medication Sig Dispense Refill  . albuterol (PROVENTIL HFA;VENTOLIN HFA) 108 (90 Base) MCG/ACT inhaler Inhale 2 puffs into the lungs every 6 (six) hours as needed for wheezing or shortness of  breath.    . Buprenorphine HCl-Naloxone HCl 8-2 MG FILM DISSOLVE 1 FILM UNDER THE TONGUE ONCE DAILY    . clindamycin (CLEOCIN) 150 MG capsule 2 tabs po tid with food 42 capsule 0  . FLUoxetine (PROZAC) 20 MG capsule Take 20 mg by mouth daily.    Marland Kitchen. gabapentin (NEURONTIN) 300 MG capsule Take 300 mg by mouth 2 (two) times daily.    Marland Kitchen. HYDROcodone-acetaminophen (NORCO/VICODIN) 5-325 MG tablet TAKE 1 TABLET BY MOUTH EVERY 6 HOURS AS NEEDED FOR pain 15 tablet 0  . ibuprofen (ADVIL,MOTRIN) 600 MG tablet Take 1 tablet (600 mg total) by mouth 4 (four) times daily. 30 tablet 0  . naproxen (NAPROSYN) 500 MG tablet Take 1 tablet (500 mg total) by mouth 2 (two) times daily with a meal. 60 tablet 5  . phenytoin (DILANTIN) 100 MG ER capsule Take 100 mg by mouth 2 (two) times daily.     No current facility-administered medications for this visit.      Physical Exam  Blood pressure 131/86, pulse 80, temperature (!) 97.3 F (36.3 C), height 5\' 6"  (1.676 m), weight 175 lb (79.4 kg).  Constitutional: overall normal hygiene, normal nutrition, well developed, normal grooming, normal body habitus. Assistive device:none  Musculoskeletal: gait and station Limp left, muscle tone and strength are normal, no tremors or atrophy is present.  .  Neurological: coordination overall normal.  Deep tendon reflex/nerve stretch intact.  Sensation normal.  Cranial nerves II-XII intact.   Skin:   Normal overall no scars, lesions, ulcers or rashes. No psoriasis.  Psychiatric: Alert and oriented x 3.  Recent memory intact, remote memory unclear.  Normal mood and affect. Well groomed.  Good eye contact.  Cardiovascular: overall no swelling, no varicosities, no edema bilaterally, normal temperatures of the legs and arms, no clubbing, cyanosis and good capillary refill.  Lymphatic: palpation is normal.  Left knee has slight effusion, ROM 0 to 115, stable, crepitus, NV intact.  Slight limp to the left.  All other systems  reviewed and are negative   The patient has been educated about the nature of the problem(s) and counseled on treatment options.  The patient appeared to understand what I have discussed and is in agreement with it.  Encounter Diagnoses  Name Primary?  . Chronic pain of left knee Yes  . Cigarette nicotine dependence without complication   . Chronic pain syndrome     PLAN Call if any problems.  Precautions discussed.  Continue current medications.   Return to clinic 2 months   Keep pain clinic appointment.  I have reviewed the [place] web site prior to prescribing narcotic medicine for this patient.   Electronically Signed Sanjuana Kava, MD 8/4/202010:56 AM

## 2019-07-05 ENCOUNTER — Telehealth: Payer: Self-pay

## 2019-07-05 NOTE — Telephone Encounter (Signed)
Hydrocodone-Acetaminophen 5/325 mg  Qty 15 Tablets  PATIENT USES [facility]

## 2019-07-06 MED ORDER — HYDROCODONE-ACETAMINOPHEN 5-325 MG PO TABS: ORAL_TABLET | ORAL | 0 refills | Status: DC

## 2019-07-12 ENCOUNTER — Telehealth: Payer: Self-pay | Admitting: Orthopaedic Surgery

## 2019-07-12 NOTE — Telephone Encounter (Signed)
Patient requests refill on Hydrocodone/Acetaminophen 5-325  Mgs.  Qty  15  Sig: TAKE 1 TABLET BY MOUTH EVERY 6 HOURS AS NEEDED FOR pain  Patient states he uses [facility]

## 2019-07-13 MED ORDER — HYDROCODONE-ACETAMINOPHEN 5-325 MG PO TABS: ORAL_TABLET | ORAL | 0 refills | Status: DC

## 2019-07-19 ENCOUNTER — Other Ambulatory Visit: Payer: Self-pay

## 2019-07-19 MED ORDER — HYDROCODONE-ACETAMINOPHEN 5-325 MG PO TABS
ORAL_TABLET | ORAL | 0 refills | Status: AC
Start: 2019-07-19 — End: ?

## 2019-07-19 NOTE — Telephone Encounter (Signed)
Dr. Brooke Bonito patient  Hydrocodone-Acetaminophen  5/325mg   Qty 15 Tablets  Take 1 tablet by mouth every 6 (six) hours as needed for pain.  PATIENT USES [facility]

## 2019-07-19 NOTE — Telephone Encounter (Signed)
Pain clinic appointment  August 31.   Dr Luna Glasgow states he will continue weekly pain medicine until he goes to the pain clinic.

## 2019-08-31 ENCOUNTER — Ambulatory Visit: Payer: Medicare HMO | Admitting: Orthopaedic Surgery

## 2019-12-30 IMAGING — MR MRI OF THE LEFT KNEE WITHOUT CONTRAST
4 of 7 series · 14 of 40 positions shown · non-contrast
Comparison: None.

CLINICAL DATA: Left knee pain.

EXAM:
MRI OF THE LEFT KNEE WITHOUT CONTRAST
TECHNIQUE: Multiplanar, multisequence MR imaging of the knee was performed. No
intravenous contrast was administered.

[Series 3: T2 fat-sat · axial · 4.0mm · 0.21mm/px · z∈[-81,+29]mm · 3 of 28 slices shown]
[im 6/28]
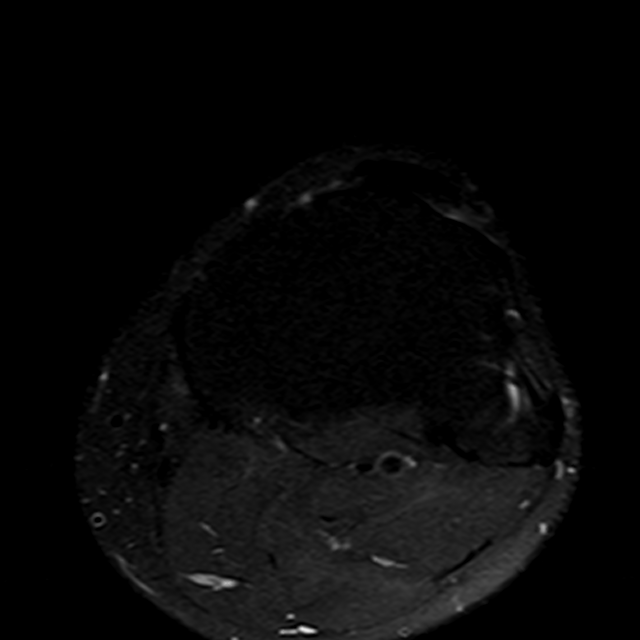
[im 17/28]
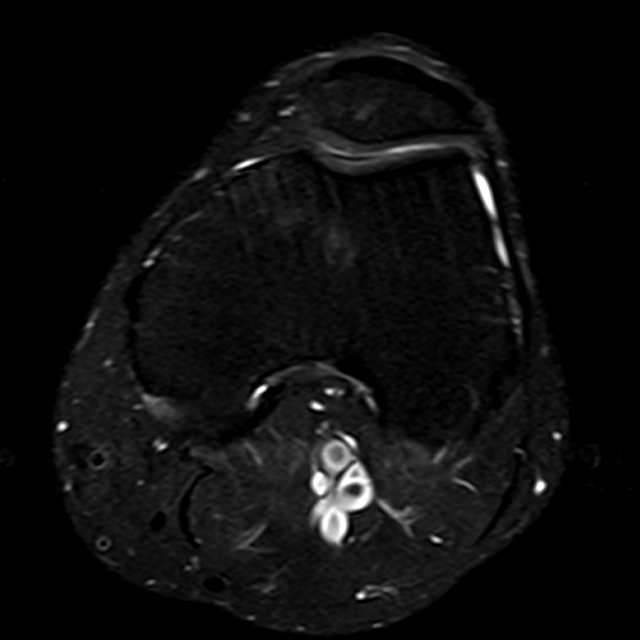
[im 28/28]
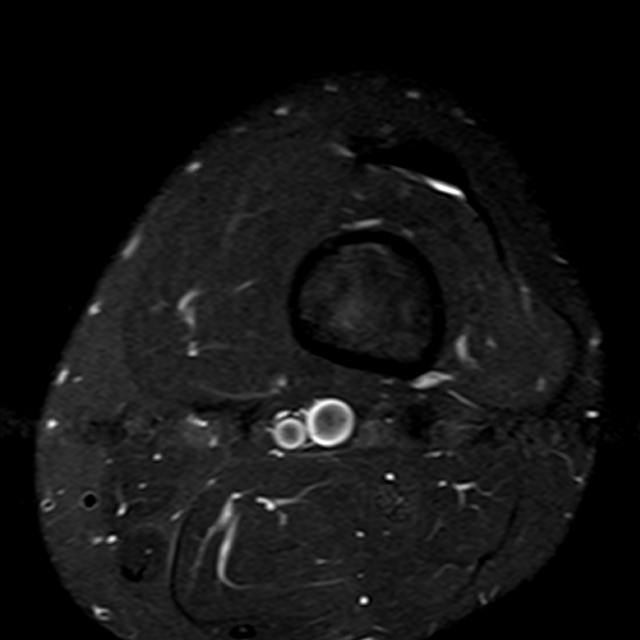

[Series 6: PD fat-sat · coronal · 3.0mm · 0.21mm/px · 5 of 32 slices shown (1 of 3)]
[im 1/32]
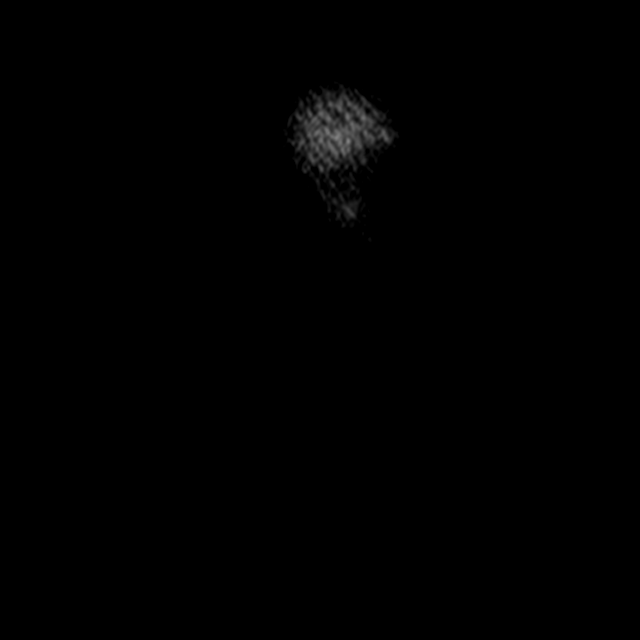
[im 6/32]
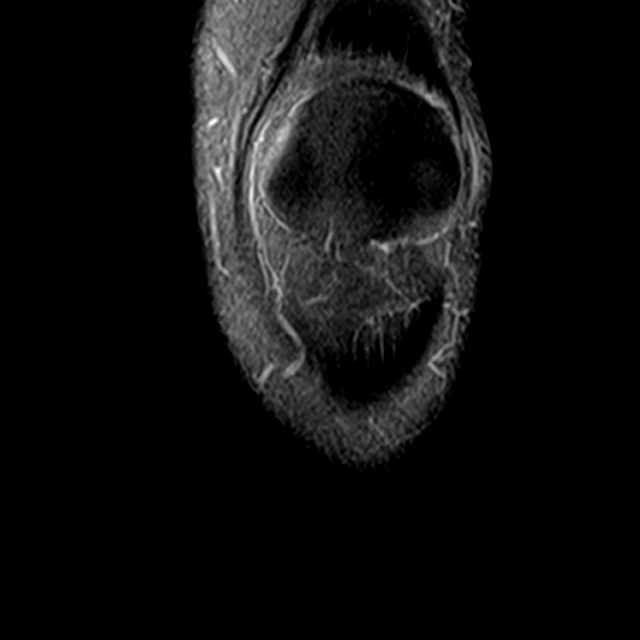
[im 11/32]
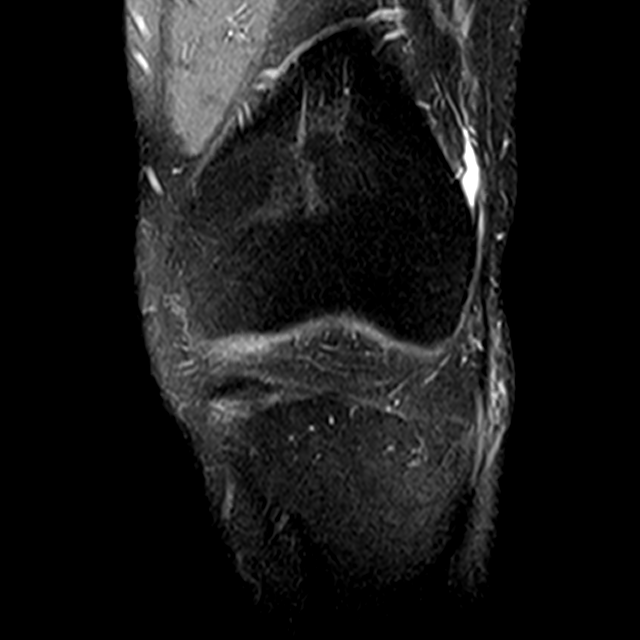
[im 16/32]
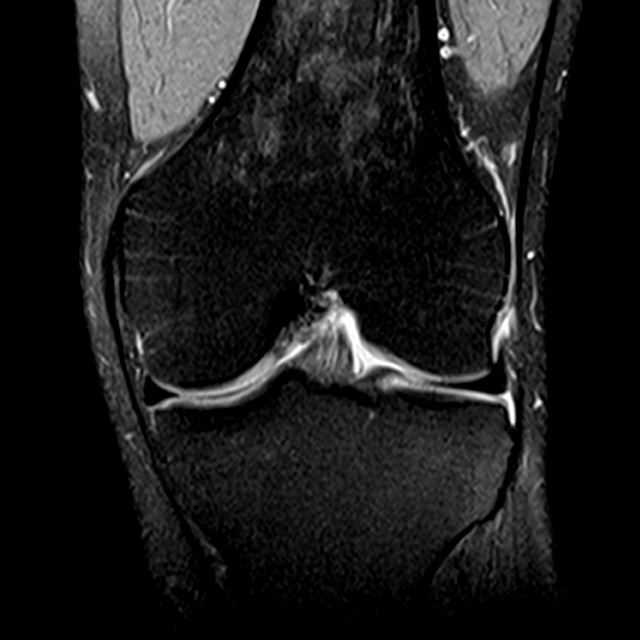
[im 26/32]
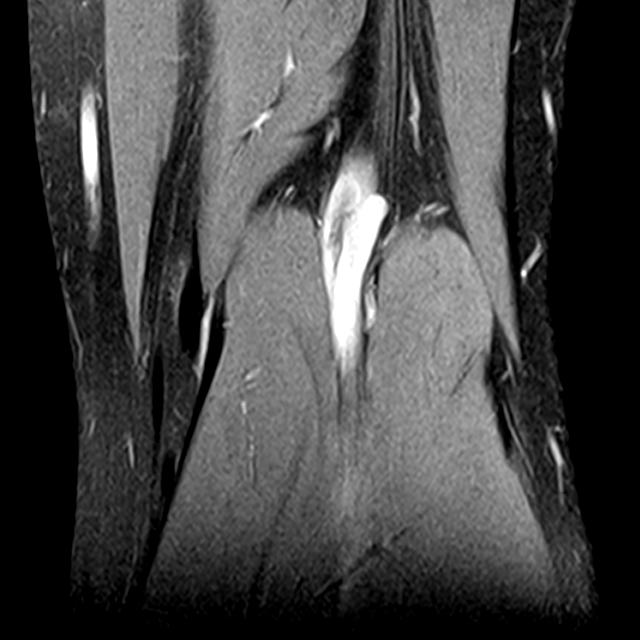

[Series 7: PD fat-sat · sagittal · 3.0mm · 0.21mm/px · 3 of 27 slices shown (2 of 3)]
[im 6/27]
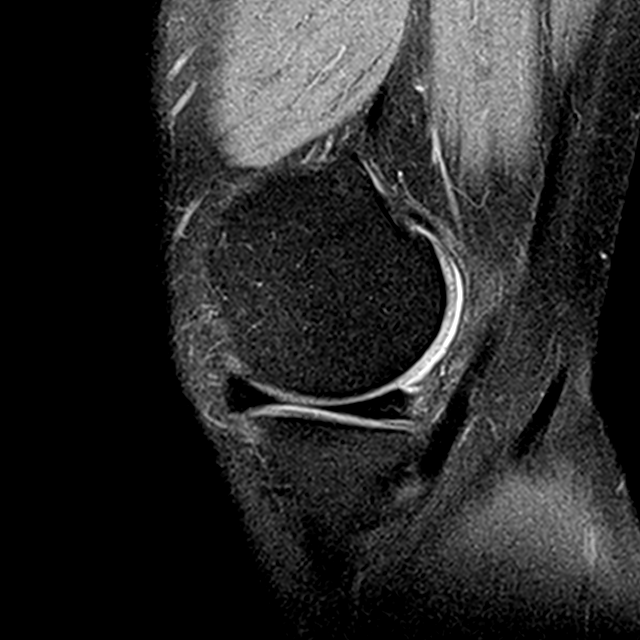
[im 16/27]
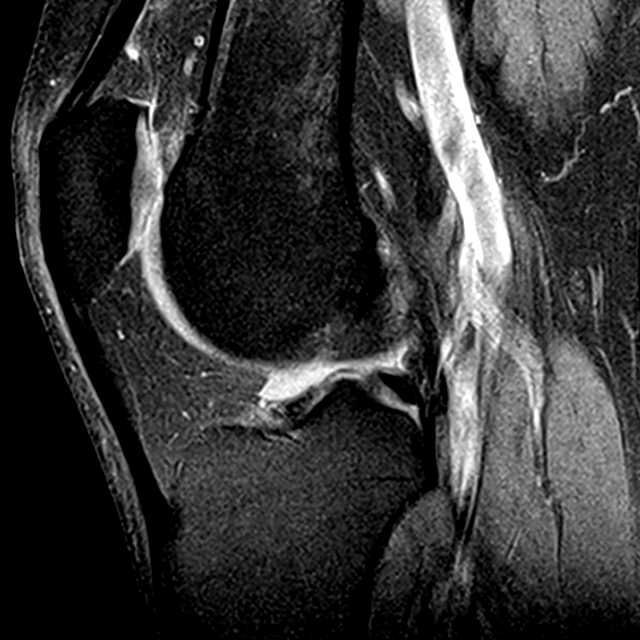
[im 27/27]
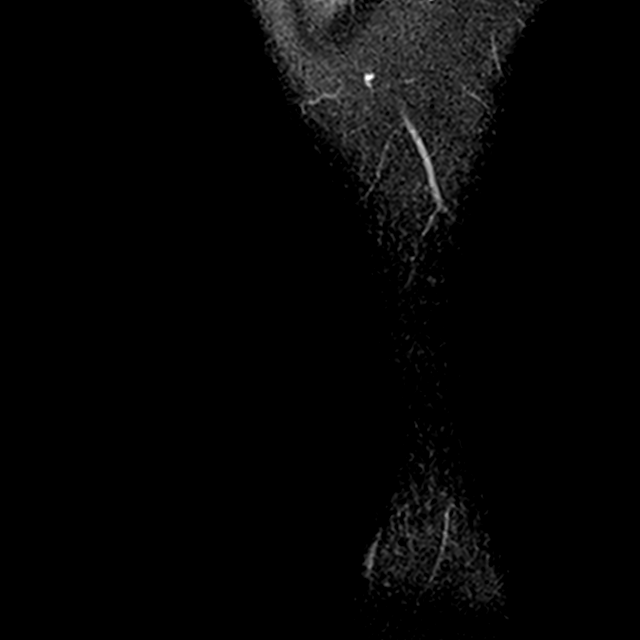

[Series 9: PD fat-sat · coronal · 2.0mm · 0.20mm/px · 3 of 15 slices shown (3 of 3)]
[im 1/15]
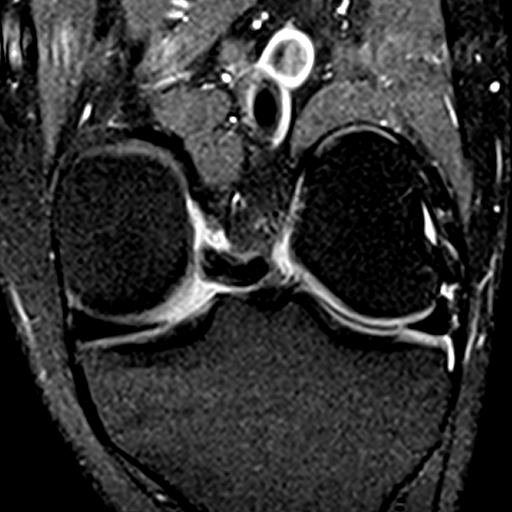
[im 8/15]
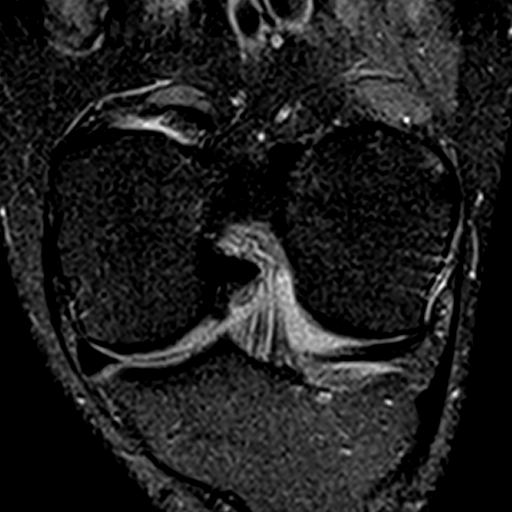
[im 15/15]
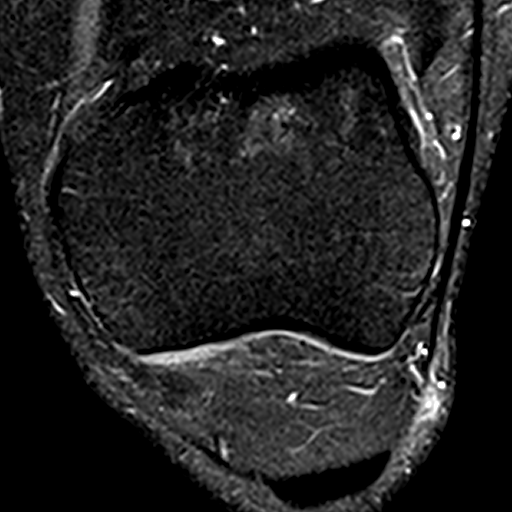

[14 of 40 positions shown; findings below may reference images not displayed]

FINDINGS: MENISCI

Medial meniscus:  Intact.

Lateral meniscus:  Intact.

LIGAMENTS

Cruciates:  Intact ACL and PCL.

Collaterals:  Insert collateral

CARTILAGE

Patellofemoral:  No chondral defect.

Medial:  No chondral defect.

Lateral:  No chondral defect.

Joint: Small joint effusion. Normal Hoffa's fat. No plical
thickening.

Popliteal Fossa:  No Baker cyst. Intact popliteus tendon.

Extensor Mechanism: Intact quadriceps tendon. Intact patellar
tendon. Intact medial patellar retinaculum. Intact lateral patellar
retinaculum. Intact MPFL.

Bones:  No acute osseous abnormality.  No aggressive osseous lesion.

Other: No fluid collection or hematoma.  Muscles are normal.
IMPRESSION: 1. No internal derangement of the left knee.

## 2021-05-03 ENCOUNTER — Other Ambulatory Visit: Payer: Self-pay

## 2021-05-03 ENCOUNTER — Ambulatory Visit: Payer: Medicaid Other

## 2021-05-03 ENCOUNTER — Encounter: Payer: Self-pay | Admitting: Orthopaedic Surgery

## 2021-05-03 ENCOUNTER — Ambulatory Visit (INDEPENDENT_AMBULATORY_CARE_PROVIDER_SITE_OTHER): Payer: Medicaid Other | Admitting: Orthopaedic Surgery

## 2021-05-03 VITALS — BP 124/83 | HR 80 | Ht 66.0 in | Wt 206.2 lb

## 2021-05-03 DIAGNOSIS — M25562 Pain in left knee: Secondary | ICD-10-CM

## 2021-05-03 DIAGNOSIS — G8929 Other chronic pain: Secondary | ICD-10-CM | POA: Diagnosis not present

## 2021-05-03 MED ORDER — NAPROXEN 500 MG PO TABS: 500.0000 mg | ORAL_TABLET | Freq: Two times a day (BID) | ORAL | 5 refills | Status: DC

## 2021-05-03 NOTE — Progress Notes (Signed)
My knee is hurting again.  He has pain in the left knee.  I last saw him 06-29-2019.  He was to go to pain clinic later that month.  He was taking Naprosyn for the knee which helped.  He did not go to the pain clinic.  He has stopped the Naprosyn.  He takes Advil now and then.  He has pain and swelling of the left knee but no giving way.  He has no redness, no trauma, no weakness.  He would like to be referred to pain clinic.  He asks for pain medicine.  He has no new trauma.  The left lower extremity is examined:  Inspection:  Thigh:  Non-tender and no defects  Knee does not have swelling 0 effusion.                        Joint tenderness is present                        Patient is tender over the medial joint line  Lower Leg:  Has normal appearance and no tenderness or defects  Ankle:  Non-tender and no defects  Foot:  Non-tender and no defects Range of Motion:  Knee:  Range of motion is: 0-115                        Crepitus is  present  Ankle:  Range of motion is normal. Strength and Tone:  The left lower extremity has normal strength and tone. Stability:  Knee:  The knee is stable.  Ankle:  The ankle is stable.  Encounter Diagnosis  Name Primary?   Chronic pain of left knee Yes   I will begin Naprosyn again.  I will set up pain clinic.  Stop the ibuprofen.  Return in one month.  Call if any problem.  Precautions discussed.  Electronically Signed Darreld Mclean, MD 6/9/20228:42 AM

## 2021-05-21 ENCOUNTER — Telehealth: Payer: Self-pay | Admitting: Orthopaedic Surgery

## 2021-05-21 NOTE — Telephone Encounter (Signed)
Patient is requesting a RX for ibuprofen  to be called in for him.  He states he can't get into Pain Management till 06/20/21.    Pharmacy: [name]   Please call him back

## 2021-05-22 MED ORDER — IBUPROFEN 800 MG PO TABS: 800.0000 mg | ORAL_TABLET | Freq: Every day | ORAL | 2 refills | Status: DC

## 2021-05-22 NOTE — Telephone Encounter (Signed)
(  Re-route to Dr Hilda Lias)  Patient requests refill of:   ibuprofen (ADVIL) 800 MG tablet / [name] Asking due to awaiting appointment w/pain management

## 2021-06-05 ENCOUNTER — Ambulatory Visit: Payer: Medicaid Other | Admitting: Orthopaedic Surgery

## 2021-09-10 ENCOUNTER — Other Ambulatory Visit: Payer: Self-pay | Admitting: Orthopaedic Surgery

## 2021-10-09 ENCOUNTER — Ambulatory Visit: Payer: Medicare Other | Admitting: Orthopaedic Surgery

## 2021-12-08 DIAGNOSIS — Z885 Allergy status to narcotic agent status: Secondary | ICD-10-CM | POA: Diagnosis not present

## 2021-12-08 DIAGNOSIS — K047 Periapical abscess without sinus: Secondary | ICD-10-CM | POA: Diagnosis not present

## 2021-12-19 DIAGNOSIS — F17200 Nicotine dependence, unspecified, uncomplicated: Secondary | ICD-10-CM | POA: Diagnosis not present

## 2021-12-20 NOTE — Congregational Nurse Program (Signed)
°  Dept: [phone]   Congregational Nurse Program Note  Date of Encounter: 12/20/2021  Past Medical History: Past Medical History:  Diagnosis Date   Chronic dental pain     Encounter Details:  CNP Questionnaire - 12/11/21 1740       Questionnaire   Do you give verbal consent to treat you today? Yes    Location Patient Served  Home of [employer], [employer]    Visit Setting [employer] or Organization    Patient Status Homeless    Insurance [facility]    Insurance Referral N/A    Medication N/A    Medical Provider Yes    Screening Referrals N/A    Medical Referral N/A    Medical Appointment Made N/A    Food Have Food Insecurities    Transportation N/A    Housing/Utilities No permanent housing    Interpersonal Safety N/A    Intervention Blood pressure    ED Visit Averted N/A    Life-Saving Intervention Made N/A           Stated he feels fine. No complaints or concerns BP 129/71 P 77  Jenene Slicker RN

## 2022-12-22 DIAGNOSIS — F17200 Nicotine dependence, unspecified, uncomplicated: Secondary | ICD-10-CM | POA: Diagnosis not present

## 2023-02-17 DIAGNOSIS — F17200 Nicotine dependence, unspecified, uncomplicated: Secondary | ICD-10-CM | POA: Diagnosis not present
